# Patient Record
Sex: Male | Born: 1960 | Race: Black or African American | Hispanic: No | Marital: Married | Smoking: Former smoker
Health system: Southern US, Community
[De-identification: ages and names within clinical notes are randomized; demographics above are authoritative.]

## PROBLEM LIST (undated history)

## (undated) DIAGNOSIS — K219 Gastro-esophageal reflux disease without esophagitis: Secondary | ICD-10-CM

## (undated) DIAGNOSIS — G8929 Other chronic pain: Secondary | ICD-10-CM

## (undated) DIAGNOSIS — H524 Presbyopia: Secondary | ICD-10-CM

## (undated) DIAGNOSIS — K029 Dental caries, unspecified: Secondary | ICD-10-CM

## (undated) DIAGNOSIS — M545 Low back pain: Secondary | ICD-10-CM

## (undated) DIAGNOSIS — E119 Type 2 diabetes mellitus without complications: Secondary | ICD-10-CM

## (undated) DIAGNOSIS — E785 Hyperlipidemia, unspecified: Secondary | ICD-10-CM

## (undated) DIAGNOSIS — H9190 Unspecified hearing loss, unspecified ear: Secondary | ICD-10-CM

## (undated) HISTORY — DX: Presbyopia: H52.4

## (undated) HISTORY — DX: Other chronic pain: G89.29

## (undated) HISTORY — DX: Hyperlipidemia, unspecified: E78.5

## (undated) HISTORY — DX: Dental caries, unspecified: K02.9

## (undated) HISTORY — DX: Low back pain: M54.5

## (undated) HISTORY — DX: Gastro-esophageal reflux disease without esophagitis: K21.9

## (undated) HISTORY — DX: Type 2 diabetes mellitus without complications: E11.9

## (undated) HISTORY — DX: Unspecified hearing loss, unspecified ear: H91.90

---

## 2002-07-20 ENCOUNTER — Encounter: Admission: RE | Admit: 2002-07-20 | Discharge: 2002-07-20 | Payer: Self-pay | Admitting: Family Medicine

## 2002-07-20 ENCOUNTER — Encounter: Payer: Self-pay | Admitting: Family Medicine

## 2002-07-29 ENCOUNTER — Encounter: Admission: RE | Admit: 2002-07-29 | Discharge: 2002-09-08 | Payer: Self-pay | Admitting: Family Medicine

## 2003-08-11 ENCOUNTER — Encounter: Admission: RE | Admit: 2003-08-11 | Discharge: 2003-08-11 | Payer: Self-pay | Admitting: Family Medicine

## 2003-08-11 ENCOUNTER — Encounter: Payer: Self-pay | Admitting: Family Medicine

## 2003-12-16 HISTORY — PX: BUNIONECTOMY: SHX129

## 2005-05-15 ENCOUNTER — Encounter: Admission: RE | Admit: 2005-05-15 | Discharge: 2005-05-15 | Payer: Self-pay | Admitting: Family Medicine

## 2007-01-21 ENCOUNTER — Emergency Department (HOSPITAL_COMMUNITY): Admission: EM | Admit: 2007-01-21 | Discharge: 2007-01-21 | Payer: Self-pay | Admitting: Family Medicine

## 2007-10-04 ENCOUNTER — Emergency Department (HOSPITAL_COMMUNITY): Admission: EM | Admit: 2007-10-04 | Discharge: 2007-10-04 | Payer: Self-pay | Admitting: Family Medicine

## 2007-11-04 ENCOUNTER — Emergency Department (HOSPITAL_COMMUNITY): Admission: EM | Admit: 2007-11-04 | Discharge: 2007-11-04 | Payer: Self-pay | Admitting: Emergency Medicine

## 2007-11-19 ENCOUNTER — Ambulatory Visit: Payer: Self-pay | Admitting: Family Medicine

## 2008-04-27 ENCOUNTER — Ambulatory Visit: Payer: Self-pay | Admitting: Family Medicine

## 2008-05-09 ENCOUNTER — Ambulatory Visit: Payer: Self-pay | Admitting: Family Medicine

## 2008-08-16 ENCOUNTER — Ambulatory Visit: Payer: Self-pay | Admitting: Family Medicine

## 2008-09-22 ENCOUNTER — Ambulatory Visit: Payer: Self-pay | Admitting: Family Medicine

## 2008-11-13 ENCOUNTER — Ambulatory Visit: Payer: Self-pay | Admitting: Family Medicine

## 2008-12-15 DIAGNOSIS — M545 Low back pain, unspecified: Secondary | ICD-10-CM

## 2008-12-15 HISTORY — DX: Low back pain, unspecified: M54.50

## 2009-08-15 LAB — HM COLONOSCOPY: HM Colonoscopy: NORMAL

## 2009-08-21 ENCOUNTER — Ambulatory Visit: Payer: Self-pay | Admitting: Family Medicine

## 2010-08-12 ENCOUNTER — Ambulatory Visit: Payer: Self-pay | Admitting: Family Medicine

## 2010-12-15 HISTORY — PX: OTHER SURGICAL HISTORY: SHX169

## 2011-01-05 ENCOUNTER — Encounter: Payer: Self-pay | Admitting: Neurosurgery

## 2011-01-30 ENCOUNTER — Ambulatory Visit: Payer: Self-pay | Admitting: Family Medicine

## 2011-04-09 ENCOUNTER — Encounter (HOSPITAL_COMMUNITY)
Admission: RE | Admit: 2011-04-09 | Discharge: 2011-04-09 | Disposition: A | Discharge status: Home / Self Care | Payer: Worker's Compensation | Source: Ambulatory Visit | Attending: Neurosurgery | Admitting: Neurosurgery

## 2011-04-09 LAB — CBC
HCT: 43.3 % (ref 39.0–52.0)
Hemoglobin: 14.6 g/dL (ref 13.0–17.0)
MCH: 29.6 pg (ref 26.0–34.0)
MCHC: 33.7 g/dL (ref 30.0–36.0)
MCV: 87.8 fL (ref 78.0–100.0)
Platelets: 273 10*3/uL (ref 150–400)
RBC: 4.93 MIL/uL (ref 4.22–5.81)

## 2011-04-09 LAB — SURGICAL PCR SCREEN
MRSA, PCR: NEGATIVE
Staphylococcus aureus: NEGATIVE

## 2011-04-17 ENCOUNTER — Other Ambulatory Visit (HOSPITAL_COMMUNITY): Payer: Self-pay | Admitting: Neurosurgery

## 2011-04-17 ENCOUNTER — Inpatient Hospital Stay (HOSPITAL_COMMUNITY)
Admission: RE | Admit: 2011-04-17 | Discharge: 2011-04-21 | DRG: 460 | Disposition: A | Discharge status: Home Health | Payer: Worker's Compensation | Source: Ambulatory Visit | Attending: Neurosurgery | Admitting: Neurosurgery

## 2011-04-17 ENCOUNTER — Ambulatory Visit (HOSPITAL_COMMUNITY)
Admission: RE | Admit: 2011-04-17 | Discharge: 2011-04-17 | Disposition: A | Discharge status: Home / Self Care | Payer: Worker's Compensation | Source: Ambulatory Visit | Attending: Neurosurgery | Admitting: Neurosurgery

## 2011-04-17 DIAGNOSIS — Z01812 Encounter for preprocedural laboratory examination: Secondary | ICD-10-CM

## 2011-04-17 DIAGNOSIS — M51379 Other intervertebral disc degeneration, lumbosacral region without mention of lumbar back pain or lower extremity pain: Principal | ICD-10-CM | POA: Diagnosis present

## 2011-04-17 DIAGNOSIS — M5137 Other intervertebral disc degeneration, lumbosacral region: Principal | ICD-10-CM | POA: Diagnosis present

## 2011-04-17 DIAGNOSIS — M549 Dorsalgia, unspecified: Secondary | ICD-10-CM

## 2011-04-17 LAB — TYPE AND SCREEN: Antibody Screen: NEGATIVE

## 2011-04-18 LAB — GLUCOSE, CAPILLARY
Glucose-Capillary: 158 mg/dL — ABNORMAL HIGH (ref 70–99)
Glucose-Capillary: 144 mg/dL — ABNORMAL HIGH (ref 70–99)

## 2011-04-19 ENCOUNTER — Encounter: Payer: Self-pay | Admitting: Family Medicine

## 2011-05-16 NOTE — Op Note (Signed)
NAME:  Parker Harvey, Parker Harvey               ACCOUNT NO.:  [id number]  MEDICAL RECORD NO.:  [id number]         PATIENT TYPE:  CINP  LOCATION:                               FACILITY:  [facility]  PHYSICIAN:  Donalee Citrin, M.D.        DATE OF BIRTH:  Jan 31, 1961  DATE OF PROCEDURE:  04/17/2011 DATE OF DISCHARGE:  04/17/2011                              OPERATIVE REPORT   PREOPERATIVE DIAGNOSIS:  Degenerative disk disease and discogenic mechanical low back pain, L4-5 and L5-S1.  PROCEDURE:  Posterior lumbar interbody fusion, L4-5 and L5-S1, using a hybrid Telamon and PEEK cage packed with locally harvested autograft, mixed with Actifuse and tangent allograft wedge, with pedicle screw fixation using a Globus REVERE  pedicle screw system from L4-S1 posterolateral arthrodesis, using locally harvested autograft, mixed with Actifuse L4-S1, and placement of a large Hemovac drain.  SURGEON:  Donalee Citrin, MD.  ASSISTANT:  Tia Alert, MD.  ANESTHESIA:  General endotracheal.  HISTORY OF PRESENT ILLNESS:  The patient is a 50 year old gentleman, who has had progressive worsening back and bilateral leg pain after work- related injury.  The patient then worked up extensively, undergone extensive conservative approaches and ultimately underwent discography for workup of discogenic mechanical back pain with an MRI scan showed annular tears and central disk bulges at L4-L5 and L5-S1.  Discography was strongly concordant at L4-L5 and L5-S1 with a strong positive control that was negative for reproducing his pain at L3-4, so the patient is recommended L4-L5 and L5-S1 fusion.  All the risks and benefits of the operation were explained to the patient.  He understood and agreed to proceed forth.  PROCEDURE IN DETAIL:  The patient was brought to the OR, was induced general anesthesia, positioned prone on the Holloway frame.  Back was prepped and draped in the usual fashion.  A midline incision was made centering over  the L4-5 and L5-S1 disk spaces after infiltration of 10 mL of lidocaine with epinephrine.  Bovie cautery was used to take down the subcutaneous tissues and subperiosteal dissection was carried out bilaterally at L4 down S1 with T-piece at L4-5 and S1 exposed. Intraoperative x-ray identified the appropriate levels of spinous processes at L4-L5 and removed.  Central decompression was completed. Complete medial facetectomies were performed bilaterally.  Aggressive unroofing of the L4-5 and S1 foramens were carried out and under biting the superior articulating facet to each level to gain lateral access to the disk spaces.  Epidural veins were then coagulated.  Attention was then taken first to the pedicle screw placement.  Using a high-speed drill, pilot holes were drilled at L4 on the right.  Cannula with the awl probe, tapped with a 5 x 5 tap, probed again with 6.5 x 45 screws inserted at L4 on the right, 6.5 x 45 at L5, and 6.5 x 35 at S1, sequentially in a similar fashion.  Fluoroscopy was used at each step along the way.  External and internal bony landmarks and probing from within the pedicle confirmed no mediolateral breach.  Then, attention was taken to interbody work, using a D'Errico to reflect the right  L4 nerve root medially.  Disk space was cleaned out.  A size 10 distractor was inserted.  This was felt to be too small for the disk spaces, so we replaced with a 12, had good apposition of the endplates and felt to be appropriate sizing for the implant, so then a disk space was cleaned out radically from the left side with a 12 distractor placed on the right. Using a size 12 cutter and chisel, endplates prepped with Epstein curettes, pituitary rongeurs, and scrapers were used to prepare the endplates.  Then, a 12 x 22 mm PEEK cage packed with local autograft, mixed with Actifuse, inserted on the patient's left side.  The distractor was removed and in similar fashion endplates were  prepped. Local autograft was packed centrally and a right-sided tangent was inserted.  Then, done at L5-S1 in similar fashion, 12 x 22 Telamon on the right, 12 x 26 tangent on the left with local autograft packed centrally.  Both disk spaces were noted to be markedly degenerated, preserving fairly tall disk height, however, the disk wall disrupted and degenerated.  After all interbody work were done, pedicle screws were placed on the left side in similar fashion with 6.5 x 45 at L4 on the left, 6.5 x 40 at L5, 6.5 x 35 at S1. Then, the wound was copiously irrigated.  Meticulous hemostasis was maintained.  Aggressive decortication was carried at T-piece and lateral gutters.  Remainder of local autograft was packed posterolaterally, 65-mm rods placed with nuts tightened down at S1. The L5 screws compressing S1 and L4 and L5.  There was good rod showing at end of each construct.  A cross-link was then applied. The foramina were reinspected prior to cross-link placement to confirm patency and no migration of graft material, and then Gelfoam was laid on top of dura.  Medium Hemovac drain was placed and wound was closed in layers with interrupted Vicryl and skin was closed with running 4-0 subcuticular.  Benzoin and Steri-Strips were applied.  The patient went to the recovery room in stable condition.  At the end of the case, sponge and instrument count were correct.          ______________________________ Donalee Citrin, M.D.     GC/MEDQ  D:  04/17/2011  T:  04/18/2011  Job:  [id number]  Electronically Signed by Donalee Citrin M.D. on 05/16/2011 06:50:42 AM

## 2011-05-27 ENCOUNTER — Ambulatory Visit
Admission: RE | Admit: 2011-05-27 | Discharge: 2011-05-27 | Disposition: A | Discharge status: Home / Self Care | Payer: Worker's Compensation | Source: Ambulatory Visit | Attending: Neurosurgery | Admitting: Neurosurgery

## 2011-05-27 ENCOUNTER — Other Ambulatory Visit: Payer: Self-pay | Admitting: Neurosurgery

## 2011-05-27 DIAGNOSIS — M545 Low back pain: Secondary | ICD-10-CM

## 2011-06-04 NOTE — Discharge Summary (Signed)
  NAME:  Parker Harvey, Parker Harvey               ACCOUNT NO.:  [id number]  MEDICAL RECORD NO.:  [id number]           PATIENT TYPE:  I  LOCATION:  3036                         FACILITY:  [facility]  PHYSICIAN:  Donalee Citrin, M.D.        DATE OF BIRTH:  Jan 10, 1961  DATE OF ADMISSION:  04/17/2011 DATE OF DISCHARGE:  04/21/2011                              DISCHARGE SUMMARY   ADMITTING DIAGNOSIS:  Degenerative disk disease, lumbar spinal stenosis at L4-5 and L5-S1.  PROCEDURE:  Decompression stabilization procedure at L4-5 and L5-S1.  HOSPITAL COURSE:  The patient was admitted as an EMA, went to the operating room, and underwent the aforementioned procedure.  Postop, the patient did very well and recovering on the floor.  On the floor, the patient mobilized well, has significant improvement in his preoperative leg pain, but did have a fair amount of incisional pain.  His pain is progressively controlled within the first couple of days.  He had a low grade fever that was treated with incentive spirometer.  Postop day 4, he was able to be discharged home with scheduled followup in approximately 2 weeks. At the time of discharge, Parker Harvey incision was clean and dry, he was ambulating and voiding spontaneously and tolerating regular diet.          ______________________________ Donalee Citrin, M.D.     GC/MEDQ  D:  05/16/2011  T:  05/16/2011  Job:  [id number]  Electronically Signed by Donalee Citrin M.D. on 06/04/2011 08:33:17 AM

## 2011-06-09 ENCOUNTER — Ambulatory Visit: Payer: Self-pay | Admitting: Medical

## 2011-08-28 ENCOUNTER — Other Ambulatory Visit: Payer: Self-pay | Admitting: Neurosurgery

## 2011-08-28 ENCOUNTER — Ambulatory Visit
Admission: RE | Admit: 2011-08-28 | Discharge: 2011-08-28 | Disposition: A | Discharge status: Home / Self Care | Payer: Worker's Compensation | Source: Ambulatory Visit | Attending: Neurosurgery | Admitting: Neurosurgery

## 2011-08-28 DIAGNOSIS — M545 Low back pain: Secondary | ICD-10-CM

## 2011-09-23 LAB — URINALYSIS, ROUTINE W REFLEX MICROSCOPIC
Bilirubin Urine: NEGATIVE
Ketones, ur: NEGATIVE
Nitrite: NEGATIVE
Protein, ur: NEGATIVE
Urobilinogen, UA: 0.2

## 2011-09-23 LAB — I-STAT 8, (EC8 V) (CONVERTED LAB)
Acid-base deficit: 1
HCT: 42
Operator id: 198171
Potassium: 4.2
Sodium: 141
TCO2: 25
pH, Ven: 7.402 — ABNORMAL HIGH

## 2011-09-23 LAB — DIFFERENTIAL
Basophils Absolute: 0
Basophils Relative: 1
Lymphocytes Relative: 29
Monocytes Absolute: 0.8
Neutro Abs: 3.8
Neutrophils Relative %: 57

## 2011-09-23 LAB — CBC
MCHC: 33.4
Platelets: 225
RDW: 12.8

## 2011-09-23 LAB — POCT CARDIAC MARKERS: Myoglobin, poc: 88.7

## 2011-11-13 ENCOUNTER — Ambulatory Visit
Admission: RE | Admit: 2011-11-13 | Discharge: 2011-11-13 | Disposition: A | Discharge status: Home / Self Care | Payer: Worker's Compensation | Source: Ambulatory Visit | Attending: Neurosurgery | Admitting: Neurosurgery

## 2011-11-13 ENCOUNTER — Other Ambulatory Visit: Payer: Self-pay | Admitting: Neurosurgery

## 2011-11-13 DIAGNOSIS — M545 Low back pain, unspecified: Secondary | ICD-10-CM

## 2013-08-10 ENCOUNTER — Encounter: Payer: Self-pay | Admitting: Internal Medicine

## 2013-08-18 ENCOUNTER — Encounter: Payer: Self-pay | Admitting: Family Medicine

## 2013-08-18 ENCOUNTER — Other Ambulatory Visit: Payer: Self-pay | Admitting: Family Medicine

## 2013-08-18 ENCOUNTER — Ambulatory Visit (INDEPENDENT_AMBULATORY_CARE_PROVIDER_SITE_OTHER): Payer: BC Managed Care – PPO | Admitting: Family Medicine

## 2013-08-18 VITALS — BP 132/100 | HR 70 | Ht 63.5 in | Wt 136.0 lb

## 2013-08-18 DIAGNOSIS — Z23 Encounter for immunization: Secondary | ICD-10-CM

## 2013-08-18 DIAGNOSIS — G8929 Other chronic pain: Secondary | ICD-10-CM | POA: Insufficient documentation

## 2013-08-18 DIAGNOSIS — Z0000 Encounter for general adult medical examination without abnormal findings: Secondary | ICD-10-CM

## 2013-08-18 DIAGNOSIS — M549 Dorsalgia, unspecified: Secondary | ICD-10-CM

## 2013-08-18 DIAGNOSIS — F329 Major depressive disorder, single episode, unspecified: Secondary | ICD-10-CM

## 2013-08-18 MED ORDER — DULOXETINE HCL 30 MG PO CPEP: 30.0000 mg | ORAL_CAPSULE | Freq: Every day | ORAL | Status: DC

## 2013-08-18 NOTE — Progress Notes (Signed)
Subjective:    Patient ID: Parker Harvey, male    DOB: 1961/09/05, 52 y.o.   MRN: [id number]  HPI He is here for complete examination. He has not been here in over 3 years. In that time he has had back surgery which apparently failed and he is now in chronic pain management. 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 He does have a cane and is using a back brace. He does complain of a one-year history of intermittent difficulty with loose stools. He he does continue on regular doses of codeine to help with his back pain. He also has had some depression symptoms from this. The PHQ screen was performed. He also states that his pain doctor, Dr. Murray Hodgkins mention something about a spot on his kidneys. I have no record of this. There is also a question of cancer in the past however he has never had any diagnosis of cancer. Social and family history were reviewed.   Review of Systems  Constitutional: Negative.   HENT: Negative.   Eyes: Negative.   Respiratory: Negative.   Cardiovascular: Negative.   Gastrointestinal: Negative.   Endocrine: Negative.   Genitourinary: Negative.   Musculoskeletal: Negative.   Skin: Negative.   Psychiatric/Behavioral: Positive for dysphoric mood. Negative for suicidal ideas.       Objective:   Physical Exam BP 132/100  Pulse 70  Ht 5' 3.5" (1.613 m)  Wt 136 lb (61.689 kg)  BMI 23.71 kg/m2  SpO2 98%  General Appearance:    Alert, cooperative, no distress, appears stated age  Head:    Normocephalic, without obvious abnormality, atraumatic  Eyes:    PERRL, conjunctiva/corneas clear, EOM's intact, fundi    benign  Ears:    Normal TM's and external ear canals  Nose:   Nares normal, mucosa normal, no drainage or sinus   tenderness  Throat:   Lips, mucosa, and tongue normal; teeth and gums normal  Neck:   Supple, no lymphadenopathy;  thyroid:  no   enlargement/tenderness/nodules; no carotid  bruit or JVD  Back:    Spine nontender, no curvature, ROM normal, no CVA     tenderness  Lungs:     Clear to auscultation bilaterally without wheezes, rales or     ronchi; respirations unlabored  Chest Wall:    No tenderness or deformity   Heart:    Regular rate and rhythm, S1 and S2 normal, no murmur, rub   or gallop  Breast Exam:    No chest wall tenderness, masses or gynecomastia  Abdomen:     Soft, non-tender, nondistended, normoactive bowel sounds,    no masses, no hepatosplenomegaly        Extremities:   No clubbing, cyanosis or edema  Pulses:   2+ and symmetric all extremities  Skin:   Skin color, texture, turgor normal, no rashes or lesions  Lymph nodes:   Cervical, supraclavicular, and axillary nodes normal  Neurologic:   CNII-XII intact, normal strength, sensation and gait; reflexes 2+ and symmetric throughout          Psych:   Normal mood, affect, hygiene and grooming.          Assessment & Plan:  Routine general medical examination at a health care facility - Plan: CBC with Differential, Comprehensive metabolic panel, Lipid panel, Urinalysis Dipstick, Flu Vaccine QUAD 36+ mos IM  Depressive disorder, not elsewhere classified - Plan: DULoxetine (CYMBALTA) 30 MG  capsule  Chronic back pain - Plan: DULoxetine (CYMBALTA) 30 MG capsule  Need for prophylactic vaccination and inoculation against influenza  since we'll need to see gastroenterology, I will defer any further evaluation of his loose stools to them. Discussed the need for him to stop all alcohol which he has agreed to do. I will start him on Cymbalta explaining that it did help both his underlying depression and pain management. Routine blood screening and followup on the potential renal issue when I receive more data. Discussion the flu shot given including risks and benefits.

## 2013-08-18 NOTE — Patient Instructions (Addendum)
Cut out all alcohol .At least check into disability to see how much she would receive Take one Cymbalta a day for the first week and then double it to 2 per day. I'll see you back here in about 3 --4 weeks

## 2013-08-19 LAB — CBC WITH DIFFERENTIAL/PLATELET
Basophils Absolute: 0 10*3/uL (ref 0.0–0.1)
HCT: 41.9 % (ref 39.0–52.0)
Lymphocytes Relative: 25 % (ref 12–46)
Monocytes Absolute: 0.6 10*3/uL (ref 0.1–1.0)
Neutro Abs: 4.1 10*3/uL (ref 1.7–7.7)
Platelets: 242 10*3/uL (ref 150–400)
RBC: 4.67 MIL/uL (ref 4.22–5.81)
RDW: 14.3 % (ref 11.5–15.5)
WBC: 6.3 10*3/uL (ref 4.0–10.5)

## 2013-08-19 LAB — COMPREHENSIVE METABOLIC PANEL
ALT: 64 U/L — ABNORMAL HIGH (ref 0–53)
AST: 118 U/L — ABNORMAL HIGH (ref 0–37)
Albumin: 4.2 g/dL (ref 3.5–5.2)
Calcium: 9.3 mg/dL (ref 8.4–10.5)
Chloride: 98 mEq/L (ref 96–112)
Potassium: 5 mEq/L (ref 3.5–5.3)
Total Protein: 7.6 g/dL (ref 6.0–8.3)

## 2013-08-22 LAB — HEPATITIS PANEL, ACUTE
HCV Ab: NEGATIVE
Hep A IgM: NEGATIVE
Hep B C IgM: NEGATIVE
Hepatitis B Surface Ag: NEGATIVE

## 2013-08-29 ENCOUNTER — Telehealth: Payer: Self-pay | Admitting: Family Medicine

## 2013-08-29 NOTE — Telephone Encounter (Signed)
He states that when he started taking the Cymbalta he had similar symptoms that he is having now. The symptoms started after he doubled the dose. I recommended that he continue on this medication for several more days and let us know.

## 2013-08-29 NOTE — Telephone Encounter (Signed)
Pt called said cymbalta is slowing his urine, but feels like it is squeezing him and choking him.  Please call pt at [phone] and advise

## 2013-09-05 ENCOUNTER — Telehealth: Payer: Self-pay | Admitting: Family Medicine

## 2013-09-05 MED ORDER — DULOXETINE HCL 60 MG PO CPEP: 60.0000 mg | ORAL_CAPSULE | Freq: Every day | ORAL | Status: DC

## 2013-09-05 NOTE — Telephone Encounter (Signed)
Go ahead and switch him to the 60 mg pill at. Explain this to him. Have him recheck with [place] in one month.

## 2013-09-05 NOTE — Telephone Encounter (Signed)
DR.LALONDE IS THIS RIGHT

## 2013-09-05 NOTE — Telephone Encounter (Signed)
PT INFORMED NEW RX SENT IN FOR 60MG  1 QD AND TO RECHECK IN 1 MTH PT VERBALIZED UNDERSTANDING

## 2013-09-08 ENCOUNTER — Telehealth: Payer: Self-pay | Admitting: Family Medicine

## 2013-09-08 NOTE — Telephone Encounter (Signed)
Doubt the bones are coming from the medication. Scheduled to be seen tomorrow by Vincenza Hews or Eve.

## 2013-09-08 NOTE — Telephone Encounter (Signed)
PT HAS APPOINTMENT 9/26 WITH DR.KNAPP

## 2013-09-09 ENCOUNTER — Ambulatory Visit: Payer: Self-pay | Admitting: Family Medicine

## 2013-09-21 ENCOUNTER — Ambulatory Visit (INDEPENDENT_AMBULATORY_CARE_PROVIDER_SITE_OTHER): Payer: BC Managed Care – PPO | Admitting: Family Medicine

## 2013-09-21 ENCOUNTER — Encounter: Payer: Self-pay | Admitting: Family Medicine

## 2013-09-21 VITALS — BP 140/100 | HR 104 | Wt 133.0 lb

## 2013-09-21 DIAGNOSIS — M549 Dorsalgia, unspecified: Secondary | ICD-10-CM

## 2013-09-21 DIAGNOSIS — G8929 Other chronic pain: Secondary | ICD-10-CM

## 2013-09-21 DIAGNOSIS — F329 Major depressive disorder, single episode, unspecified: Secondary | ICD-10-CM

## 2013-09-21 NOTE — Patient Instructions (Signed)
Stop the medicine for the rest this week and call me next week and let me know.

## 2013-09-21 NOTE — Progress Notes (Signed)
  Subjective:    Patient ID: Parker Harvey, male    DOB: 06-13-1961, 52 y.o.   MRN: [id number]  HPI He is here for recheck. He states that since starting the Cymbalta he has noted difficulty with urinating. He has the urge but cannot get his stream going. He does state that the medicine did help reduce his pain.   Review of Systems     Objective:   Physical Exam Alert and in no distress otherwise not examined       Assessment & Plan:  Chronic back pain  Depressive disorder, not elsewhere classified  he is to stop the Cymbalta through the weekend and see if this will help with normal urinary flow. If it doesn't I will switch to Effexor

## 2013-09-27 ENCOUNTER — Telehealth: Payer: Self-pay | Admitting: Internal Medicine

## 2013-09-27 NOTE — Telephone Encounter (Signed)
Pt states that he was doing well being off the cymbalta but he is having trouble sleeping. He states that he has nightmares at night. If you want to send something to his pharmacy he uses [facility]. He would like you to call him

## 2013-09-27 NOTE — Telephone Encounter (Signed)
He has been off Cymbalta for 8 days. He is having some difficulty with nightmares but states that they're getting slightly better. Recommended watchful waiting and the use of an OTC sleep meds for several days.

## 2014-05-25 ENCOUNTER — Ambulatory Visit: Payer: BC Managed Care – PPO | Admitting: Family Medicine

## 2014-05-29 ENCOUNTER — Ambulatory Visit (INDEPENDENT_AMBULATORY_CARE_PROVIDER_SITE_OTHER): Payer: BC Managed Care – PPO | Admitting: Family Medicine

## 2014-05-29 ENCOUNTER — Telehealth: Payer: Self-pay | Admitting: Family Medicine

## 2014-05-29 ENCOUNTER — Encounter: Payer: Self-pay | Admitting: Family Medicine

## 2014-05-29 VITALS — BP 102/80 | Wt 136.0 lb

## 2014-05-29 DIAGNOSIS — K602 Anal fissure, unspecified: Secondary | ICD-10-CM

## 2014-05-29 NOTE — Progress Notes (Signed)
   Subjective:    Patient ID: Parker Harvey, male    DOB: 1960-12-20, 53 y.o.   MRN: [id number]  HPI He is here for evaluation of anal discomfort. He has a previous history of anal fissure. For the last several weeks he's also been on codeine to help with back pain and notes increased difficulty with constipation as well as some anal discomfort.   Review of Systems     Objective:   Physical Exam Alert and in no distress. Exam of the anus does show a healing lesion present at 6 and 12:00.       Assessment & Plan:  Anal fissure  information concerning anal fissure given. Recommended fluids, bulk in diet, exercise as much is possible. Also recommended sitz baths. He apparently was given an unknown medication and will check at home and let me know. I will probably call this in.

## 2014-05-29 NOTE — Patient Instructions (Signed)
Anal Fissure, Adult An anal fissure is a small tear or crack in the skin around the anus. Bleeding from a fissure usually stops on its own within a few minutes. However, bleeding will often reoccur with each bowel movement until the crack heals.  CAUSES   Passing large, hard stools.  Frequent diarrheal stools.  Constipation.  Inflammatory bowel disease (Crohn's disease or ulcerative colitis).  Infections.  Anal sex. SYMPTOMS   Small amounts of blood seen on your stools, on toilet paper, or in the toilet after a bowel movement.  Rectal bleeding.  Painful bowel movements.  Itching or irritation around the anus. DIAGNOSIS Your caregiver will examine the anal area. An anal fissure can usually be seen with careful inspection. A rectal exam may be performed and a short tube (anoscope) may be used to examine the anal canal. TREATMENT   You may be instructed to take fiber supplements. These supplements can soften your stool to help make bowel movements easier.  Sitz baths may be recommended to help heal the tear. Do not use soap in the sitz baths.  A medicated cream or ointment may be prescribed to lessen discomfort. HOME CARE INSTRUCTIONS   Maintain a diet high in fruits, whole grains, and vegetables. Avoid constipating foods like bananas and dairy products.  Take sitz baths as directed by your caregiver.  Drink enough fluids to keep your urine clear or pale yellow.  Only take over-the-counter or prescription medicines for pain, discomfort, or fever as directed by your caregiver. Do not take aspirin as this may increase bleeding.  Do not use ointments containing numbing medications (anesthetics) or hydrocortisone. They could slow healing. SEEK MEDICAL CARE IF:   Your fissure is not completely healed within 3 days.  You have further bleeding.  You have a fever.  You have diarrhea mixed with blood.  You have pain.  Your problem is getting worse rather than  better. MAKE SURE YOU:   Understand these instructions.  Will watch your condition.  Will get help right away if you are not doing well or get worse. Document Released: 12/01/2005 Document Revised: 02/23/2012 Document Reviewed: 05/18/2011 [facility] Patient Information 2014 [place], [place]. Plenty of fluids, bulk in your diet, as much exercise as you can do, warm tub baths or shower. Can use Metamucil if you want.

## 2014-05-29 NOTE — Telephone Encounter (Signed)
Go ahead and call us in and say to apply to affected area 2 or 3 times per day as needed

## 2014-05-30 NOTE — Telephone Encounter (Signed)
Medication called in 

## 2014-07-26 DIAGNOSIS — Z029 Encounter for administrative examinations, unspecified: Secondary | ICD-10-CM

## 2014-10-23 ENCOUNTER — Ambulatory Visit: Payer: BC Managed Care – PPO | Admitting: Family Medicine

## 2015-06-01 ENCOUNTER — Telehealth: Payer: Self-pay | Admitting: Internal Medicine

## 2015-06-01 NOTE — Telephone Encounter (Signed)
Faxed over medical records to [facility] @ [phone]  Today 06/01/15

## 2015-08-27 ENCOUNTER — Other Ambulatory Visit: Payer: Self-pay | Admitting: Internal Medicine

## 2015-08-27 DIAGNOSIS — G8929 Other chronic pain: Secondary | ICD-10-CM

## 2015-08-27 DIAGNOSIS — M545 Low back pain: Principal | ICD-10-CM

## 2015-09-03 ENCOUNTER — Ambulatory Visit
Admission: RE | Admit: 2015-09-03 | Discharge: 2015-09-03 | Disposition: A | Discharge status: Home / Self Care | Payer: No Typology Code available for payment source | Source: Ambulatory Visit | Attending: Internal Medicine | Admitting: Internal Medicine

## 2015-09-03 DIAGNOSIS — M545 Low back pain, unspecified: Secondary | ICD-10-CM

## 2015-09-03 DIAGNOSIS — G8929 Other chronic pain: Secondary | ICD-10-CM

## 2015-09-03 MED ORDER — METHYLPREDNISOLONE ACETATE 40 MG/ML INJ SUSP (RADIOLOG
120.0000 mg | Freq: Once | INTRAMUSCULAR | Status: AC
  Administered 2015-09-03: 120 mg via EPIDURAL

## 2015-09-03 MED ORDER — IOHEXOL 180 MG/ML  SOLN
1.0000 mL | Freq: Once | INTRAMUSCULAR | Status: DC | PRN
  Administered 2015-09-03: 1 mL via EPIDURAL

## 2015-09-03 NOTE — Discharge Instructions (Signed)

## 2015-10-23 ENCOUNTER — Telehealth: Payer: Self-pay | Admitting: Internal Medicine

## 2015-10-23 NOTE — Telephone Encounter (Signed)
Spoke with patient he has appointment for Nov 19th so he will wait until then

## 2015-10-23 NOTE — Telephone Encounter (Signed)
Patient called and would like to speak with Dr. Delrae AlfredMulberry.  He has been experiencing hair loss under his arms and on his chest.  Please call [phone].

## 2015-10-23 NOTE — Telephone Encounter (Signed)
Please let him know that I would likely have to see him first and get more info and take a look at him.

## 2015-11-23 LAB — POCT GLUCOSE (2 HR PP): Glucose 2 Hr PP, POC: 112 mg/dL

## 2015-12-03 ENCOUNTER — Encounter: Payer: Self-pay | Admitting: Internal Medicine

## 2015-12-03 ENCOUNTER — Ambulatory Visit (INDEPENDENT_AMBULATORY_CARE_PROVIDER_SITE_OTHER): Payer: No Typology Code available for payment source | Admitting: Internal Medicine

## 2015-12-03 VITALS — BP 118/76 | HR 74 | Ht 62.5 in | Wt 147.0 lb

## 2015-12-03 DIAGNOSIS — K029 Dental caries, unspecified: Secondary | ICD-10-CM | POA: Insufficient documentation

## 2015-12-03 DIAGNOSIS — M549 Dorsalgia, unspecified: Secondary | ICD-10-CM

## 2015-12-03 DIAGNOSIS — G8929 Other chronic pain: Secondary | ICD-10-CM

## 2015-12-03 DIAGNOSIS — H524 Presbyopia: Secondary | ICD-10-CM | POA: Insufficient documentation

## 2015-12-03 MED ORDER — GABAPENTIN 300 MG PO CAPS: ORAL_CAPSULE | ORAL | Status: DC

## 2015-12-03 NOTE — Progress Notes (Signed)
   Subjective:    Patient ID: Parker Harvey, male    DOB: [birth year], 54 y.o.   MRN: [id number]  HPI  1.  Low Back Pain:  Pt. Went for epidural injection of low back about 2-3 months ago.  Had improvement for 1 month.  Did not hear from PT.  Sent a referral in August.   Pt. Not interested in repeat epidural injections--states had at least 3 with Dr. Murray HodgkinsBartko in past, but I was not able to find definitive info in old records we received from his office.   Sometimes low back pain keeps him up at night.  2.  Eye referral:  Not clear, but patient states for the referrals we ordered, he received paperwork and states he did not meet criteria.  Denies ever letting his orange card expire.  Uses reading glasses, but would like eye exam as reading not as clear as he would like it to be.    3.  Dental Referral:  As above.         Review of Systems     Objective:   Physical Exam  HEENT:  Multiple missing and caried teeth.  Gingiva without significant erythema and swelling. Neck:  Supple, no adenopathy Chest:  CTA CV:  RRR without murmur or rub, radial pulses normal and equal.      Assessment & Plan:  1.  Chronic low back pain:  Continue Diclofenac--does help.  Rerefer to PT.  Did not get a slot when referred previously--let pt. Know to call if he does not hear from orange card by end of January. Unable to get Lidoderm patches through [facility] any longer. Pt. States he has never taken Gabapentin/Neurontin and would be willing to try. 2.  Dental Decay-rerefer to Dental 3.  Presbyopia (likely)  Rerefer to optometry.

## 2015-12-03 NOTE — Patient Instructions (Addendum)
Can google "advance directives, [place]"  And bring up form from [place] of [place]. Print and fill out Or can go to "5 wishes"  Which is also in Spanish and fill out--this costs $5--perhaps easier to use. Designate a Medical Power of Attorney to speak for you if you are unable to speak for yourself when ill or injured  Call if you do not hear about referrals for eye, teeth, PT by end of January.

## 2015-12-16 DIAGNOSIS — E782 Mixed hyperlipidemia: Secondary | ICD-10-CM | POA: Insufficient documentation

## 2015-12-16 DIAGNOSIS — E785 Hyperlipidemia, unspecified: Secondary | ICD-10-CM

## 2015-12-16 HISTORY — DX: Hyperlipidemia, unspecified: E78.5

## 2016-01-08 ENCOUNTER — Telehealth: Payer: Self-pay | Admitting: Internal Medicine

## 2016-01-18 ENCOUNTER — Encounter: Payer: Self-pay | Admitting: Internal Medicine

## 2016-01-18 ENCOUNTER — Ambulatory Visit (INDEPENDENT_AMBULATORY_CARE_PROVIDER_SITE_OTHER): Payer: Self-pay | Admitting: Internal Medicine

## 2016-01-18 VITALS — BP 116/78 | HR 70 | Ht 62.5 in | Wt 149.0 lb

## 2016-01-18 DIAGNOSIS — M549 Dorsalgia, unspecified: Secondary | ICD-10-CM

## 2016-01-18 DIAGNOSIS — G4700 Insomnia, unspecified: Secondary | ICD-10-CM | POA: Insufficient documentation

## 2016-01-18 DIAGNOSIS — B351 Tinea unguium: Secondary | ICD-10-CM | POA: Insufficient documentation

## 2016-01-18 DIAGNOSIS — R252 Cramp and spasm: Secondary | ICD-10-CM

## 2016-01-18 DIAGNOSIS — Z79899 Other long term (current) drug therapy: Secondary | ICD-10-CM

## 2016-01-18 DIAGNOSIS — G8929 Other chronic pain: Secondary | ICD-10-CM

## 2016-01-18 MED ORDER — GABAPENTIN 100 MG PO CAPS: ORAL_CAPSULE | ORAL | Status: DC

## 2016-01-18 MED ORDER — TERBINAFINE HCL 250 MG PO TABS: 250.0000 mg | ORAL_TABLET | Freq: Every day | ORAL | Status: DC

## 2016-01-18 NOTE — Patient Instructions (Signed)
Bar of [employer] in between sheets of bed--place at foot of bed.  Turn of TV every night between 10 and 11 p.m. No coffee or other source of caffeine after lunch Go to bed and get up at the same time. If you cannot fall asleep after 20 minutes, or you wake up and cannot get back to sleep in 20 minutes, get up and read one of your [employer] (or other sports books)  On second thought, reading about the [employer] may be exactly what you need to do to fall asleep! Keep walking and getting outside every day--good job  Spray your shoes with Lysol every time you wear them and continue to do so until you throw them out. Wash floor of shower twice weekly with bleach containing cleaner

## 2016-01-18 NOTE — Progress Notes (Signed)
   Subjective:    Patient ID: Parker Harvey, male    DOB: 09-04-1961, 55 y.o.   MRN: [id number]  HPI   1.  Chronic back pain:  Took Gabapentn 300 mg capsule in the morning daily for a month through the 20 th or so of January.  States he was out for a couple of days at one point as was moving and couldn't get to them.   Had some nausea initially as well as general weakness, but that resolved and patient did have some improvement in his back pain.  Felt he was able to do more things while taking the Gabapentin Has not heard anything from physical therapy  2.  Insomnia:  Stays up all night watching TV.  Drinks coffee and colas in the evening.  Walks outside daily.    3.  Muscle cramps in legs and feet, especially at night.  Tried the tonic water, but did not help.  Would be willing to try soap between the sheets.  4.  Darkening and pitting of ring finger nails with irritation of surrounding skin.  All of his toenails started this way and are crumbling and thick now.    5.  Dental and Eye referral:  Has not heard anything yet.        Review of Systems     Objective:   Physical Exam NAD Stands somewhat stiffly from chair.  Limited forward flexion.  Gait normal. Nails:  2 ring fingers with flaking and mild swelling about nail bed with darkened pitted nails.   Toenails diffusely with thickened discolored yellow nails.  Flaking also about nailbeds.      Assessment & Plan:  1.  Chronic back pain: Continue Diclofenac.  Not clear as to why he could not get gabapentin--thought this was not a [facility] program med.  [facility] to [facility].  Does sound like this helped.  Discussed he should take at bedtime to help with sleep. Will try to represcribe the 100 mg cap and titrate him up more slowly with intial nausea and fatigue. Checking on PT referral  2.  Insomnia:  Discussed needs significant change in sleep hygiene--the main one being to turn off the TV at 10 or 11  p.m. And go to bed. Has sports books to read.  3.  ONychomycosis:  Terbinafine 250 mg once daily for 90 days.  Shoe and shower floor treatment discussed.  4.  Muscle cramps--nocturnal mainly.  Try Ivory soap between sheets at foot of bed.

## 2016-01-19 LAB — COMPREHENSIVE METABOLIC PANEL
ALT: 26 IU/L (ref 0–44)
AST: 25 IU/L (ref 0–40)
Albumin/Globulin Ratio: 1.6 (ref 1.1–2.5)
Albumin: 4.2 g/dL (ref 3.5–5.5)
Alkaline Phosphatase: 64 IU/L (ref 39–117)
BUN/Creatinine Ratio: 9 (ref 9–20)
BUN: 11 mg/dL (ref 6–24)
Bilirubin Total: 0.4 mg/dL (ref 0.0–1.2)
CO2: 24 mmol/L (ref 18–29)
Calcium: 9.7 mg/dL (ref 8.7–10.2)
Chloride: 103 mmol/L (ref 96–106)
Creatinine, Ser: 1.23 mg/dL (ref 0.76–1.27)
GFR calc Af Amer: 78 mL/min/{1.73_m2} (ref >59)
GFR calc non Af Amer: 67 mL/min/{1.73_m2} (ref >59)
Globulin, Total: 2.7 g/dL (ref 1.5–4.5)
Glucose: 90 mg/dL (ref 65–99)
Potassium: 4.7 mmol/L (ref 3.5–5.2)
Sodium: 140 mmol/L (ref 134–144)
Total Protein: 6.9 g/dL (ref 6.0–8.5)

## 2016-02-29 ENCOUNTER — Ambulatory Visit: Payer: No Typology Code available for payment source | Admitting: Internal Medicine

## 2016-03-04 ENCOUNTER — Ambulatory Visit (INDEPENDENT_AMBULATORY_CARE_PROVIDER_SITE_OTHER): Payer: Self-pay | Admitting: Internal Medicine

## 2016-03-04 ENCOUNTER — Encounter: Payer: Self-pay | Admitting: Internal Medicine

## 2016-03-04 VITALS — BP 118/78 | HR 80 | Ht 62.5 in | Wt 150.0 lb

## 2016-03-04 DIAGNOSIS — K029 Dental caries, unspecified: Secondary | ICD-10-CM

## 2016-03-04 DIAGNOSIS — K047 Periapical abscess without sinus: Secondary | ICD-10-CM

## 2016-03-04 DIAGNOSIS — R361 Hematospermia: Secondary | ICD-10-CM

## 2016-03-04 MED ORDER — PENICILLIN V POTASSIUM 250 MG PO TABS: 250.0000 mg | ORAL_TABLET | Freq: Four times a day (QID) | ORAL | Status: DC

## 2016-03-04 MED ORDER — HYDROCODONE-ACETAMINOPHEN 5-325 MG PO TABS: 1.0000 | ORAL_TABLET | Freq: Four times a day (QID) | ORAL | Status: DC | PRN

## 2016-03-04 NOTE — Progress Notes (Signed)
   Subjective:    Patient ID: Parker Harvey, male    DOB: [birth year], 55 y.o.   MRN: [id number]  HPI   1.  Dental Pain:  Has had low grade pain for a while, but about 3 days ago developed significant pain and swelling above upper incisor on the left.  Has not had any drainage.  Is taking Diclofenac.  Was taking some OTC medication for back and also BC powders and Advil.   Has not been seen by Dentist since starting with us.  2.  Noted streak of blood in his ejaculate a little less than a week ago (with a dream).  Cannot recall the last time he had ejaculate to know if there was something normal recently.  Has not had since.  He woke up just as he was ejaculatiing and noted the blood streaking.  When he urinated, there was no blood--just light yellow urine.  No dysuria.  No pain with ejaculation.  No perineal pain, no flank pain.  No fever.  No urinary frequency.  No urinary hesitancy or decreased urine flow.  No history of prostatitis.  Meds:  Diclofenac 75 mg twice daily Gabapentin 100 mg --not taking for 2 days as concerned about blood in semen Terbinafine 250 mg daily--not taking  No Known Allergies Review of Systems     Objective:   Physical Exam  HEENT:  PERRL, EOMI, swelling of gum above upper left incisor, no fluctuance.  Terrible dental decay diffusely.  Throat without injection,  TMs pearly gray Neck:  Supple, no adenopathy Chest:  CTA CV:  RRR without murmur or rub, radial pulses normal and equal Abd:  S, NT, No HSM or mass, +BS GU and Rectal:  No genital lesions, No penile discharge or shaft tendernes, No testicular mass or tenderness.  Prostate mildly enlarged, but smooth and not boggy.  Pt. States somewhat tender.     Assessment & Plan:  1.  Hematospermia:  UA, PSA:  Pt. Could not give specimen today for UA, will return with specimen tomorrow.   2.  Dental decay abscessed tooth:  Pen VK 250 mg 4 times daily for 7 days and Hydrocodone/Acetaminophen 5 mg/325 mg #20 to  take every 6 hours for pain.  .  Urgent dental referral.

## 2016-03-05 LAB — POCT URINALYSIS DIPSTICK
BILIRUBIN UA: NEGATIVE
GLUCOSE UA: NEGATIVE
KETONES UA: NEGATIVE
Leukocytes, UA: NEGATIVE
Nitrite, UA: NEGATIVE
Protein, UA: NEGATIVE
RBC UA: NEGATIVE
SPEC GRAV UA: 1.02
UROBILINOGEN UA: 0.2
pH, UA: 5

## 2016-03-05 LAB — PSA: Prostate Specific Ag, Serum: 1.1 ng/mL (ref 0.0–4.0)

## 2016-04-02 ENCOUNTER — Ambulatory Visit: Payer: No Typology Code available for payment source | Admitting: Internal Medicine

## 2016-04-02 ENCOUNTER — Encounter: Payer: Self-pay | Admitting: Internal Medicine

## 2016-04-02 ENCOUNTER — Ambulatory Visit (INDEPENDENT_AMBULATORY_CARE_PROVIDER_SITE_OTHER): Payer: Self-pay | Admitting: Internal Medicine

## 2016-04-02 VITALS — BP 106/74 | HR 60 | Resp 16 | Ht 62.5 in | Wt 150.0 lb

## 2016-04-02 DIAGNOSIS — G8929 Other chronic pain: Secondary | ICD-10-CM

## 2016-04-02 DIAGNOSIS — J302 Other seasonal allergic rhinitis: Secondary | ICD-10-CM

## 2016-04-02 DIAGNOSIS — M549 Dorsalgia, unspecified: Secondary | ICD-10-CM

## 2016-04-02 MED ORDER — GABAPENTIN 100 MG PO CAPS: ORAL_CAPSULE | ORAL | Status: DC

## 2016-04-02 MED ORDER — CETIRIZINE HCL 10 MG PO TABS: 10.0000 mg | ORAL_TABLET | Freq: Every day | ORAL | Status: DC

## 2016-04-02 NOTE — Progress Notes (Signed)
   Subjective:    Patient ID: Parker Harvey, male    DOB: 1961/03/18, 55 y.o.   MRN: [id number]  HPI   1.  Chronic Low Back Pain:  Sleeps okay with Gabapentin--his wife's snoring really is what keeps him up at this point.  The 300 mg at bedtime has helped control more severe pain.  Still with shooting pains if sits too long or bends over.  Would like to garden, but then has back pain subsequently.  [facility]  Is really just plain bored--chews tobacco to pass the time.  Does get out an walk.  Wants to be outside.  Continues on Diclofenac as well:  Diclofenac definitely helps  2.  Dental decay:  Not severe pain as before.  Has appt. With dental clinic May 18th.  3.  Toenail Onychomycosis:  Never went and purchased Terbinafine--could not afford.    4.  Clear nasal discharge.  Has used unknown allergy meds, and some sort of unknown nasal spray.  Meds:   Diclofenac 75 mg twice daily Gabapentin 100 mg 3 caps at bedtime.  No Known Allergies   History  Smoking status  . Never Smoker   Smokeless tobacco  . Current User  . Types: Chew    Comment: still using dip twice daily.  Bored and chews.  Limited by back    Review of Systems     Objective:   Physical Exam HEENT:  PERRL, EOMI, conjunctivae mildly injected, TMs pearly gray, nasal mucosa swollen with clear discharge,  posterior pharynx with mild cobbling.  Severe dental decay. Neck:  Supple, no adenopathy Chest:  CTA CV:  RRR without murmur or rub, radial pulses normal and equal MS:  Tender over L/S spinous processes.   Neuro:  Motor 5/5, DTRs 2+/4 bilateral LE.  Stiff gait after sitting for short period.       Assessment & Plan:  1.  Chronic low back pain with radiculopathy:  Start morning dose of Gabapentin and titrate to 300 mg.  Continue evening dose of 300 mg.  Discussed may need to add another midday dose if not adequate control of pain.  See #3.  2.  Dental Decay:  Dental visit soon  3.  Tobacco  abuse:  Encouraged getting involved with gardening, raised beds, volunteering with orange card sign up, etc. Discussed he needs to recontact [facility] and see where they are with his scholarship application--should get into the pool  4.  Seasonal Allergies:  Zyrtec 10 mg daily.  To call in the nasal spray he has at home.  Avoid using if Afrin.

## 2016-04-02 NOTE — Patient Instructions (Addendum)
Can google "advance directives, [place]"  And bring up form from [place]. Print and fill out Or can go to "5 wishes"  Which is also in Spanish and fill out--this costs $5--perhaps easier to use. Designate a CytogeneticistMedical Power of Attorney to speak for you if you are unable to speak for yourself when ill or injured  Call in name of nasal spray you are using.

## 2016-06-10 ENCOUNTER — Other Ambulatory Visit: Payer: Self-pay | Admitting: Internal Medicine

## 2016-07-10 ENCOUNTER — Telehealth: Payer: Self-pay | Admitting: Internal Medicine

## 2016-07-10 NOTE — Telephone Encounter (Signed)
Patient called requesting medication refill for diclofenac (VOLTAREN) 75 MG EC tablet . Please send to [facility] at [facility]

## 2016-07-11 MED ORDER — DICLOFENAC SODIUM 75 MG PO TBEC: 75.0000 mg | DELAYED_RELEASE_TABLET | Freq: Two times a day (BID) | ORAL | 0 refills | Status: DC

## 2016-07-11 NOTE — Telephone Encounter (Signed)
rx sent

## 2016-08-04 ENCOUNTER — Ambulatory Visit (INDEPENDENT_AMBULATORY_CARE_PROVIDER_SITE_OTHER): Payer: No Typology Code available for payment source | Admitting: Internal Medicine

## 2016-08-04 ENCOUNTER — Encounter: Payer: Self-pay | Admitting: Internal Medicine

## 2016-08-04 DIAGNOSIS — G8929 Other chronic pain: Secondary | ICD-10-CM

## 2016-08-04 DIAGNOSIS — M545 Low back pain: Secondary | ICD-10-CM

## 2016-08-04 DIAGNOSIS — K029 Dental caries, unspecified: Secondary | ICD-10-CM

## 2016-08-04 MED ORDER — METAXALONE 400 MG PO TABS: ORAL_TABLET | ORAL | 0 refills | Status: DC

## 2016-08-04 NOTE — Patient Instructions (Signed)
Brush your teeth twice daily and floss daily before you brush your teeth at bedtime

## 2016-08-04 NOTE — Progress Notes (Signed)
   Subjective:    Patient ID: Parker Harvey, male    DOB: 05-22-1961, 55 y.o.   MRN: [id number]  HPI   1.  Constant swelling and pain of gums in past 2 weeks.  Has improved in past 2 days.  Cancelled appt. Last week with dental as had a death in the family.  Has rescheduled.  Really, not having any pain today.  No fever.  2.  Chronic back pain:  Last week, had a terrible back pain -- couldn't move due to the pain.  Lasted about 20 minutes.  Stabbing pain across low back.  No injury or overuse in the days preceding.  Current Meds  Medication Sig  . cetirizine (ZYRTEC) 10 MG tablet Take 1 tablet (10 mg total) by mouth daily.  . diclofenac (VOLTAREN) 75 MG EC tablet Take 1 tablet (75 mg total) by mouth 2 (two) times daily with a meal.  . gabapentin (NEURONTIN) 100 MG capsule 3 caps by mouth in morning and 3 caps by mouth before bedteim  . ibuprofen (ADVIL,MOTRIN) 200 MG tablet Take 800 mg by mouth every 8 (eight) hours as needed for moderate pain.   No Known Allergies   Review of Systems     Objective:   Physical Exam   HEENT:  Periodontal disease, throat without injection,  Neck:  Supple, no adenopathy Chest:  CTA CV:  RRR without murmur or rub, radial pulses normal and equal MS/Neuro exam: unchanged.        Assessment & Plan:  1.  Dental disease:  Has appt. With dental clinic rescheduled.  Encouraged to floss daily and brush twice daily.  2.  Chronic Back pain:  Not interested in increasing Gabapentin.  Willing to try a different muscle relaxant.  Metaxolone 400 mg 1-2 tabs twice daily as needed.  To call if too expensive. Not to take both ibuprofen and Diclofenac, just one or the other.  To take with food.

## 2016-08-15 ENCOUNTER — Other Ambulatory Visit: Payer: Self-pay | Admitting: Licensed Clinical Social Worker

## 2016-08-19 ENCOUNTER — Telehealth: Payer: Self-pay | Admitting: Internal Medicine

## 2016-08-19 NOTE — Telephone Encounter (Signed)
Is there a substitute for this?

## 2016-08-19 NOTE — Telephone Encounter (Signed)
Patient called stating metaxalone (SKELAXIN) 400 MG tablet was over $100.00 and he cannot afford to pay that amount and would like to [facility] something [facility]

## 2016-08-20 MED ORDER — CYCLOBENZAPRINE HCL 5 MG PO TABS: ORAL_TABLET | ORAL | 0 refills | Status: DC

## 2016-08-20 NOTE — Telephone Encounter (Signed)
Let him know I sent [facility]aprine to [facility].  Should be around $4-10

## 2016-08-20 NOTE — Telephone Encounter (Signed)
Unable to reach patient by phone today.

## 2016-08-29 ENCOUNTER — Other Ambulatory Visit: Payer: Self-pay | Admitting: Internal Medicine

## 2016-08-29 NOTE — Telephone Encounter (Signed)
No correct number in the patient chart to call.

## 2016-10-02 ENCOUNTER — Other Ambulatory Visit: Payer: Self-pay | Admitting: Internal Medicine

## 2016-10-02 ENCOUNTER — Telehealth: Payer: Self-pay | Admitting: Internal Medicine

## 2016-10-02 NOTE — Telephone Encounter (Signed)
Patient called to ask Dr. Delrae AlfredMulberry if the pain he is having on his lower back is due drinking coffee everyday in the morning. Please advise. Patient has FU in January

## 2016-10-10 NOTE — Telephone Encounter (Signed)
No, I would not think that would cause his low back pain.

## 2016-10-13 NOTE — Telephone Encounter (Signed)
Phone # not accepting calls at this moment.

## 2016-10-14 ENCOUNTER — Telehealth: Payer: Self-pay

## 2016-10-14 NOTE — Telephone Encounter (Signed)
[facility] for DOS May 30, 1024 to present. Pt last seen here on 05/29/2014. No records to be released as requested.   LM for Rosey Batheresa with Med Rec Department to [facility]

## 2016-11-05 ENCOUNTER — Other Ambulatory Visit: Payer: Self-pay | Admitting: Internal Medicine

## 2017-01-01 ENCOUNTER — Ambulatory Visit: Payer: Self-pay | Admitting: Internal Medicine

## 2017-01-07 ENCOUNTER — Other Ambulatory Visit: Payer: Self-pay

## 2017-01-07 DIAGNOSIS — G8929 Other chronic pain: Secondary | ICD-10-CM

## 2017-01-07 DIAGNOSIS — M549 Dorsalgia, unspecified: Principal | ICD-10-CM

## 2017-01-07 MED ORDER — GABAPENTIN 100 MG PO CAPS: ORAL_CAPSULE | ORAL | 6 refills | Status: DC

## 2017-02-03 ENCOUNTER — Ambulatory Visit: Payer: Self-pay | Admitting: Internal Medicine

## 2017-02-10 ENCOUNTER — Ambulatory Visit: Payer: Self-pay | Admitting: Internal Medicine

## 2017-02-17 ENCOUNTER — Other Ambulatory Visit: Payer: Self-pay | Admitting: Internal Medicine

## 2017-04-23 ENCOUNTER — Ambulatory Visit (INDEPENDENT_AMBULATORY_CARE_PROVIDER_SITE_OTHER): Payer: Self-pay | Admitting: Internal Medicine

## 2017-04-23 ENCOUNTER — Encounter: Payer: Self-pay | Admitting: Internal Medicine

## 2017-04-23 VITALS — BP 112/78 | HR 76 | Resp 12 | Ht 63.0 in | Wt 156.0 lb

## 2017-04-23 DIAGNOSIS — M549 Dorsalgia, unspecified: Secondary | ICD-10-CM

## 2017-04-23 DIAGNOSIS — K029 Dental caries, unspecified: Secondary | ICD-10-CM

## 2017-04-23 DIAGNOSIS — M25462 Effusion, left knee: Secondary | ICD-10-CM

## 2017-04-23 DIAGNOSIS — G8929 Other chronic pain: Secondary | ICD-10-CM

## 2017-04-23 MED ORDER — DICLOFENAC SODIUM 75 MG PO TBEC: 75.0000 mg | DELAYED_RELEASE_TABLET | Freq: Two times a day (BID) | ORAL | 2 refills | Status: DC

## 2017-04-23 MED ORDER — CYCLOBENZAPRINE HCL 5 MG PO TABS: ORAL_TABLET | ORAL | 0 refills | Status: DC

## 2017-04-23 NOTE — Progress Notes (Signed)
   Subjective:    Patient ID: Parker Harvey, male    DOB: 12-28-1960, 56 y.o.   MRN: [id number]  HPI   1.  Dental concerns:  Did go to dentist.  Had Xrays.  Cleaned teeth and sent to a specialist to have some teeth removed that could not get done at the dental clinic. Has been unable to afford this dentist, even the $30. Gums bleed when he brushes.  Gums swollen again. Would like to just get all his teeth pulled and then to get dentures. Wife has had hip replacement and subsequent infection.  Aunt they live with was in hospital with a stroke and in hospital for 5 months, now requiring lots of care at home. Taking Ibuprofen for dental pain along with his Diclofenac  2.  Chronic Back Pain:  Not doing exercises given from PT.  [facility] and would like an Rx for Cyclobenzaprine when he has a bad day.  Willing to titrate up on Gabapentin.  3.  Left knee pain popping and swelling at times.  Uses a cane already.  Current Meds  Medication Sig  . diclofenac (VOLTAREN) 75 MG EC tablet TAKE ONE TABLET BY MOUTH TWICE DAILY WITH A MEAL  . gabapentin (NEURONTIN) 100 MG capsule 3 caps by mouth in morning and 3 caps by mouth before bedteim  . ibuprofen (ADVIL,MOTRIN) 200 MG tablet Take 800 mg by mouth every 8 (eight) hours as needed for moderate pain.   No Known Allergies    Review of Systems     Objective:   Physical Exam NAD HEENT:  Dental decay--teeth broken at gumline with diffuse periodontal disease as well. Neck:  Supple, No adenopathy. Chest:  CTA CV:  RRR without murmur or rub, radial pulses normal and equal Back:  Tender over paraspinous musculature.  Exam unchanged. Left knee with mildly decreased flexion, full extension.  NT on stress maneuvers of cruciates and collateral ligaments.  NT over medial and lateral joint lines.       Assessment & Plan:  1.  Back and left knee pain:  Discussed only to take one antiinflammatory medication.  Diclofenac works best  per patient.  Stop Ibuprofen.  Cyclobenazaprine for bad days with back pain. Titrate up to 600 mg of gabapentin at bedtime.  Maintain 300 mg in morning.  Once on those doses, switch to 300 mg caps.  2.  Dental decay:  Will await when patient feels he can afford the $30 clinic visit to have more work done.   Floss daily

## 2017-04-23 NOTE — Patient Instructions (Addendum)
Keep morning dose of Gabapentin to 3 capsules. Tonight increase evening dose to 4 caps On Sunday, increase to 5 caps and next Wednesday increase to 6 caps. When you come in for your fasting labs in 2 weeks, let Cherice know how you are doing with that dosing and I will change you to the 300 mg caps if you are doing okay.

## 2017-04-27 ENCOUNTER — Telehealth: Payer: Self-pay | Admitting: Internal Medicine

## 2017-04-27 NOTE — Telephone Encounter (Signed)
Error

## 2017-05-06 ENCOUNTER — Other Ambulatory Visit (INDEPENDENT_AMBULATORY_CARE_PROVIDER_SITE_OTHER): Payer: Self-pay

## 2017-05-06 DIAGNOSIS — Z79899 Other long term (current) drug therapy: Secondary | ICD-10-CM

## 2017-05-06 DIAGNOSIS — Z13220 Encounter for screening for lipoid disorders: Secondary | ICD-10-CM

## 2017-05-07 LAB — COMPREHENSIVE METABOLIC PANEL
A/G RATIO: 1.4 (ref 1.2–2.2)
ALT: 26 IU/L (ref 0–44)
AST: 25 IU/L (ref 0–40)
Albumin: 4.3 g/dL (ref 3.5–5.5)
Alkaline Phosphatase: 63 IU/L (ref 39–117)
BUN/Creatinine Ratio: 10 (ref 9–20)
BUN: 12 mg/dL (ref 6–24)
Bilirubin Total: 0.5 mg/dL (ref 0.0–1.2)
CALCIUM: 9.6 mg/dL (ref 8.7–10.2)
CHLORIDE: 103 mmol/L (ref 96–106)
CO2: 23 mmol/L (ref 18–29)
Creatinine, Ser: 1.25 mg/dL (ref 0.76–1.27)
GFR calc Af Amer: 75 mL/min/{1.73_m2} (ref >59)
GFR, EST NON AFRICAN AMERICAN: 65 mL/min/{1.73_m2} (ref >59)
GLOBULIN, TOTAL: 3 g/dL (ref 1.5–4.5)
Glucose: 85 mg/dL (ref 65–99)
Potassium: 4.7 mmol/L (ref 3.5–5.2)
SODIUM: 142 mmol/L (ref 134–144)
Total Protein: 7.3 g/dL (ref 6.0–8.5)

## 2017-05-07 LAB — CBC WITH DIFFERENTIAL/PLATELET
Basophils Absolute: 0 x10E3/uL (ref 0.0–0.2)
Basos: 0 %
EOS (ABSOLUTE): 0.2 x10E3/uL (ref 0.0–0.4)
Eos: 2 %
Hematocrit: 40.8 % (ref 37.5–51.0)
Hemoglobin: 12.5 g/dL — ABNORMAL LOW (ref 13.0–17.7)
Immature Grans (Abs): 0 x10E3/uL (ref 0.0–0.1)
Immature Granulocytes: 0 %
Lymphocytes Absolute: 3 x10E3/uL (ref 0.7–3.1)
Lymphs: 40 %
MCH: 28.3 pg (ref 26.6–33.0)
MCHC: 30.6 g/dL — ABNORMAL LOW (ref 31.5–35.7)
MCV: 92 fL (ref 79–97)
Monocytes Absolute: 0.6 x10E3/uL (ref 0.1–0.9)
Monocytes: 8 %
Neutrophils Absolute: 3.7 x10E3/uL (ref 1.4–7.0)
Neutrophils: 50 %
Platelets: 263 x10E3/uL (ref 150–379)
RBC: 4.42 x10E6/uL (ref 4.14–5.80)
RDW: 13.9 % (ref 12.3–15.4)
WBC: 7.4 x10E3/uL (ref 3.4–10.8)

## 2017-05-07 LAB — LIPID PANEL W/O CHOL/HDL RATIO
Cholesterol, Total: 215 mg/dL — ABNORMAL HIGH (ref 100–199)
HDL: 51 mg/dL
LDL Calculated: 124 mg/dL — ABNORMAL HIGH (ref 0–99)
Triglycerides: 201 mg/dL — ABNORMAL HIGH (ref 0–149)
VLDL Cholesterol Cal: 40 mg/dL (ref 5–40)

## 2017-05-12 ENCOUNTER — Other Ambulatory Visit: Payer: Self-pay

## 2017-05-12 MED ORDER — GABAPENTIN 300 MG PO CAPS
300.0000 mg | ORAL_CAPSULE | Freq: Two times a day (BID) | ORAL | 11 refills | Status: DC
Start: 2017-05-12 — End: 2017-11-30

## 2017-07-15 ENCOUNTER — Telehealth: Payer: Self-pay | Admitting: Internal Medicine

## 2017-07-15 NOTE — Telephone Encounter (Signed)
Patient called stating needs antibiotics has few days with sore gum.

## 2017-07-15 NOTE — Telephone Encounter (Signed)
Called all 3 numbers listed in chart and none of them are working numbers.

## 2017-07-20 ENCOUNTER — Telehealth: Payer: Self-pay | Admitting: Internal Medicine

## 2017-07-20 NOTE — Telephone Encounter (Signed)
Patient went to pick up Rx and the charge was higher than he usually pays.  Rx is now $19.00 and was $4.00. [phone].  Rx needed is diclofenac (VOLTAREN) 75 mg.

## 2017-07-21 ENCOUNTER — Other Ambulatory Visit: Payer: Self-pay

## 2017-07-21 MED ORDER — DICLOFENAC SODIUM 75 MG PO TBEC: 75.0000 mg | DELAYED_RELEASE_TABLET | Freq: Two times a day (BID) | ORAL | 1 refills | Status: DC

## 2017-07-22 NOTE — Telephone Encounter (Signed)
Rx sent to health department. Mrs. Pat called patient to inform him. States [facility].

## 2017-07-24 ENCOUNTER — Ambulatory Visit: Payer: Self-pay | Admitting: Internal Medicine

## 2017-08-20 ENCOUNTER — Telehealth: Payer: Self-pay | Admitting: Internal Medicine

## 2017-08-20 NOTE — Telephone Encounter (Signed)
Patient called stating needs a Rx on Diclofenac ( Voltaren) 75  Mg.  To Cherice for further action

## 2017-08-20 NOTE — Telephone Encounter (Signed)
Rx was sent to the health department last month. Need accurate phone number for patient

## 2017-08-31 ENCOUNTER — Ambulatory Visit: Payer: Self-pay | Admitting: Internal Medicine

## 2017-09-08 ENCOUNTER — Encounter: Payer: Self-pay | Admitting: Internal Medicine

## 2017-09-08 ENCOUNTER — Ambulatory Visit (INDEPENDENT_AMBULATORY_CARE_PROVIDER_SITE_OTHER): Payer: Self-pay | Admitting: Internal Medicine

## 2017-09-08 VITALS — BP 124/84 | HR 76 | Resp 12 | Ht 63.0 in | Wt 159.0 lb

## 2017-09-08 DIAGNOSIS — Z0000 Encounter for general adult medical examination without abnormal findings: Secondary | ICD-10-CM

## 2017-09-08 MED ORDER — NAPROXEN 500 MG PO TABS: 500.0000 mg | ORAL_TABLET | Freq: Two times a day (BID) | ORAL | 11 refills | Status: DC

## 2017-09-08 MED ORDER — GABAPENTIN 100 MG PO CAPS: ORAL_CAPSULE | ORAL | 6 refills | Status: DC

## 2017-09-08 NOTE — Patient Instructions (Addendum)
Can google "advance directives, [place]"  And bring up form from Secretary of [place]. Print and fill out Or can go to "5 wishes"  Which is also in Spanish and fill out--this costs $5--perhaps easier to use. Designate a Cytogeneticist to speak for you if you are unable to speak for yourself when ill or injured   Call when you are ready to try the pill for your feet and nails.  It costs 10 dollars at [employer] Try Tea Tree oil on your feet at night.  Ivory soap at the foot of bed either in between sheets or under fitted sheet.  Cut away fuzziness as develops  For the Gabapentin:  Take 300 mg caps twice daily.  Add one 100 mg cap at bedtime to the 300 mg cap.  In 3-7 days add another 100 mg cap to make 500 mg, in another 3-7 days, add a 3rd 100 mg cap to total 600 mg at bedtime.  If you continue to have a lot of pain, do the same increase with the morning dose. Once you are taking 600 mg twice daily, let me know and will change you to two 300 mg caps twice daily.

## 2017-09-08 NOTE — Progress Notes (Signed)
Subjective:    Patient ID: Parker Harvey, male    DOB: 1961/07/08, 56 y.o.   MRN: [id number]  HPI   Here for Male CPE:  1.  STE:  Does not perform regularly.  No family history.  2.  PSA/DRE: PSA last checked 02/13/2016 and 1.1.  Prostate exam then as well.  No family history of prostate cancer.  3.  Guaiac Cards:  Not sure.    4.  Colonoscopy: Underwent with Dr. Loreta Ave in 2010 and was normal.  No family history of colon cancer.  States a sister with history of cancer, but not aware of primary.  5.  Cholesterol/Glucose:  History of hyperlipidemia.  History of elevated glucose  6.  Immunizations:  Immunization History  Administered Date(s) Administered  . Influenza,inj,Quad PF,6+ Mos 08/18/2013  . Influenza-Unspecified 11/23/2015  . Tdap 12/15/2008   Current Meds  Medication Sig  . gabapentin (NEURONTIN) 300 MG capsule Take 1 capsule (300 mg total) by mouth 2 (two) times daily.  . [DISCONTINUED] diclofenac (VOLTAREN) 75 MG EC tablet Take 1 tablet (75 mg total) by mouth 2 (two) times daily with a meal.   No Known Allergies   Past Medical History:  Diagnosis Date  . 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  Previously followed by Dr. Murray Hodgkins for chronic pain.  Epidural injections help, but pt. concernd with having too many and they hurt.  . Dental decay   . Hyperlipidemia 2017  . Presbyopia    Past Surgical History:  Procedure Laterality Date  . BUNIONECTOMY Left 2005   [facility]  . low back surgery  2012   Dr. Wynetta Emery:  L4-5 and L5-S1 decompression and stabilization surgery   Family History  Problem Relation Age of Onset  . Heart disease Father 45       MI-cause of death  . Hypertension Father   . Stroke Father   . Hypertension Mother   . Diabetes Mother   . Heart disease Mother        MI cause of death  . Hypertension Sister        1 sister with hypertension  . Cancer Sister        2 sisters with  breast cancer  . Hypertension Brother        1 brother with hypertension  . Heart disease Brother        Brother with hypertension has had 2 MIs and CABG    Social History   Social History  . Marital status: Married    Spouse name: Parker Harvey  . Number of children: 3  . Years of education: 10   Occupational History  . Custodian at [employer]    Social History Main Topics  . Smoking status: Never Smoker  . Smokeless tobacco: Current User    Types: Chew     Comment: still using dip twice daily.  Bored and chews.  Limited by back  . Alcohol use 0.0 oz/week     Comment: Beer 2-3 in a weekend  . Drug use: No  . Sexual activity: Yes     Comment: wife post menopausal   Other Topics Concern  . Not on file   Social History Narrative   [place] [address] with aunt, wife, 2 young grandchildren.  Mother of children lives on [place].   Has 3 children he considers his own, but are biologically  his wife's and her previous partner's   Unable to work due to chronic back issues.         Review of Systems  Constitutional: Negative for fatigue, fever and unexpected weight change.  HENT: Positive for dental problem (Has been to dentist, but no income, so cannot return.  Having some swelling on hard palate). Negative for ear pain, hearing loss, sinus pain, sinus pressure, sneezing and sore throat.   Eyes: Positive for visual disturbance (has reading glasses and feels they work well for him.).  Respiratory: Positive for shortness of breath (with jogging.  ).   Cardiovascular: Negative for chest pain, palpitations and leg swelling.  Gastrointestinal: Negative for abdominal pain, blood in stool (no melena), diarrhea and nausea.  Genitourinary: Positive for frequency (nocturia, but drinks a lot of water daily and before goes to bed.). Negative for dysuria and testicular pain.       No urinary hesitation.  Good urinary stream. No further hematospermia    Musculoskeletal:       Chronic knee and back problems Foot cramps  Neurological: Negative for dizziness, weakness and numbness.  Hematological: Negative for adenopathy. Does not bruise/bleed easily.  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        Objective:   Physical Exam  Constitutional: He is oriented to person, place, and time. He appears well-developed and well-nourished.  HENT:  Head: Normocephalic and atraumatic.  Right Ear: Hearing, tympanic membrane, external ear and ear canal normal.  Left Ear: Hearing, tympanic membrane, external ear and ear canal normal.  Nose: Nose normal.  Mouth/Throat: Uvula is midline, oropharynx is clear and moist and mucous membranes are normal. Abnormal dentition (poor dentition with breakage of teeth, cavities and more lost teeth than teeth present.).    Eyes: Pupils are equal, round, and reactive to light. Conjunctivae and EOM are normal.  Discs sharp bilaterally  Neck: Normal range of motion and full passive range of motion without pain. Neck supple. No thyromegaly present.  Cardiovascular: Normal rate, regular rhythm, S1 normal and S2 normal.  Exam reveals no S3, no S4 and no friction rub.   No murmur heard. No carotid bruits.  Carotid, radial, femoral, DP and PT pulses normal and equal.   Pulmonary/Chest: Effort normal and breath sounds normal.  Abdominal: Soft. Bowel sounds are normal. He exhibits no mass. There is no hepatosplenomegaly. There is no tenderness. Hernia confirmed negative in the right inguinal area and confirmed negative in the left inguinal area.  Fingertip reducible umbilical hernia.  Genitourinary: Rectum normal, testes normal and penis normal. Rectal exam shows no mass, anal tone normal and guaiac negative stool.  Musculoskeletal: Normal range of motion.  Lymphadenopathy:       Head (right side): No submental and no submandibular adenopathy present.       Head  (left side): No submental and no submandibular adenopathy present.    He has no cervical adenopathy.    He has no axillary adenopathy.       Right: No inguinal and no supraclavicular adenopathy present.       Left: No inguinal and no supraclavicular adenopathy present.  Neurological: He is alert and oriented to person, place, and time. He has normal strength and normal reflexes. He displays normal reflexes. No cranial nerve deficit or sensory deficit. Coordination and gait normal.  Skin: Skin is warm.  Genital area with areas of ashiness and pubic hair loss.   Feet with thickened flaking areas  over heels and plantar areas Toenails brown orange and breaking/thickened.  States using topical Lamisil. Not clear if better.  Psychiatric: He has a normal mood and affect. His speech is normal and behavior is normal. Judgment and thought content normal. Cognition and memory are normal.          Assessment & Plan:  1.  CPE Fasting labs in 1 week:  FLP, CMP, CBC.  Has normal PSA last year.  2.  Toenail onychomycosis,tinea pedis and tinea cruris:  To use topical Terbinafine twice daily as well as tea tree oil for foot involvement.  He is not interested in oral Terbinafine for 90 day treatment at this time.  3.  Leg cramps:  Ivory soap between sheets at foot of bed.  4.  Chronic low back pain:  Titrate up on Gabapentin to see if better control of pain.  Max 600 mg twice daily.

## 2017-09-17 ENCOUNTER — Other Ambulatory Visit (INDEPENDENT_AMBULATORY_CARE_PROVIDER_SITE_OTHER): Payer: Self-pay

## 2017-09-17 DIAGNOSIS — Z1211 Encounter for screening for malignant neoplasm of colon: Secondary | ICD-10-CM

## 2017-09-17 LAB — POC HEMOCCULT BLD/STL (HOME/3-CARD/SCREEN)
Card #3 Fecal Occult Blood, POC: NEGATIVE
FECAL OCCULT BLD: NEGATIVE
Fecal Occult Blood, POC: NEGATIVE

## 2017-09-22 ENCOUNTER — Other Ambulatory Visit (INDEPENDENT_AMBULATORY_CARE_PROVIDER_SITE_OTHER): Payer: Self-pay

## 2017-09-22 DIAGNOSIS — Z13220 Encounter for screening for lipoid disorders: Secondary | ICD-10-CM

## 2017-09-22 DIAGNOSIS — Z0000 Encounter for general adult medical examination without abnormal findings: Secondary | ICD-10-CM

## 2017-09-23 LAB — CBC WITH DIFFERENTIAL/PLATELET
BASOS ABS: 0 10*3/uL (ref 0.0–0.2)
BASOS: 0 %
EOS (ABSOLUTE): 0.1 10*3/uL (ref 0.0–0.4)
Eos: 2 %
Hematocrit: 40.5 % (ref 37.5–51.0)
Hemoglobin: 13.4 g/dL (ref 13.0–17.7)
IMMATURE GRANS (ABS): 0 10*3/uL (ref 0.0–0.1)
IMMATURE GRANULOCYTES: 0 %
LYMPHS: 41 %
Lymphocytes Absolute: 2.4 10*3/uL (ref 0.7–3.1)
MCH: 28.7 pg (ref 26.6–33.0)
MCHC: 33.1 g/dL (ref 31.5–35.7)
MCV: 87 fL (ref 79–97)
MONOS ABS: 0.7 10*3/uL (ref 0.1–0.9)
Monocytes: 12 %
NEUTROS PCT: 45 %
Neutrophils Absolute: 2.6 10*3/uL (ref 1.4–7.0)
PLATELETS: 276 10*3/uL (ref 150–379)
RBC: 4.67 x10E6/uL (ref 4.14–5.80)
RDW: 14.5 % (ref 12.3–15.4)
WBC: 5.9 10*3/uL (ref 3.4–10.8)

## 2017-09-23 LAB — COMPREHENSIVE METABOLIC PANEL
A/G RATIO: 1.6 (ref 1.2–2.2)
ALK PHOS: 74 IU/L (ref 39–117)
ALT: 26 IU/L (ref 0–44)
AST: 27 IU/L (ref 0–40)
Albumin: 4.5 g/dL (ref 3.5–5.5)
BUN/Creatinine Ratio: 13 (ref 9–20)
BUN: 17 mg/dL (ref 6–24)
Bilirubin Total: 0.8 mg/dL (ref 0.0–1.2)
CALCIUM: 9.7 mg/dL (ref 8.7–10.2)
CHLORIDE: 106 mmol/L (ref 96–106)
CO2: 21 mmol/L (ref 20–29)
Creatinine, Ser: 1.36 mg/dL — ABNORMAL HIGH (ref 0.76–1.27)
GFR calc Af Amer: 68 mL/min/{1.73_m2} (ref >59)
GFR, EST NON AFRICAN AMERICAN: 59 mL/min/{1.73_m2} — AB (ref >59)
Globulin, Total: 2.8 g/dL (ref 1.5–4.5)
Glucose: 107 mg/dL — ABNORMAL HIGH (ref 65–99)
POTASSIUM: 4.9 mmol/L (ref 3.5–5.2)
SODIUM: 141 mmol/L (ref 134–144)
Total Protein: 7.3 g/dL (ref 6.0–8.5)

## 2017-09-23 LAB — LIPID PANEL W/O CHOL/HDL RATIO
CHOLESTEROL TOTAL: 203 mg/dL — AB (ref 100–199)
HDL: 42 mg/dL (ref >39)
LDL Calculated: 112 mg/dL — ABNORMAL HIGH (ref 0–99)
Triglycerides: 246 mg/dL — ABNORMAL HIGH (ref 0–149)
VLDL CHOLESTEROL CAL: 49 mg/dL — AB (ref 5–40)

## 2017-09-28 ENCOUNTER — Telehealth: Payer: Self-pay | Admitting: Internal Medicine

## 2017-09-28 NOTE — Telephone Encounter (Signed)
Patient calling as he was requested to call when he is about to complete Rx diclofenac .  Patient was told to go to naproxyn 500 mg Wants this medicine called into [facility] at [facility].

## 2017-09-29 NOTE — Telephone Encounter (Signed)
To Dr. Mulberry for further direction 

## 2017-09-29 NOTE — Telephone Encounter (Signed)
I already sent the Rx for Naproxen.  He has many refills.

## 2017-09-30 NOTE — Telephone Encounter (Signed)
Spoke with patient wife. Informed to have patient stay at 2 instead of taking 3. Patient verbalized understanding

## 2017-09-30 NOTE — Telephone Encounter (Signed)
Patient called to inform doctor that the increase in gabapentin is causing him to become weak and dizzy.  Patient has stopped taking increase in gabapentin.

## 2017-10-22 ENCOUNTER — Telehealth: Payer: Self-pay | Admitting: Internal Medicine

## 2017-10-22 NOTE — Telephone Encounter (Signed)
Patient called stating will like to see if will possible to transfer naproxen (NAPROSYN) 500 MG tablet to [facility]; Patient stated he thinks can get it   cheaper there than [facility]. Please advise.

## 2017-10-23 MED ORDER — NAPROXEN 500 MG PO TABS: 500.0000 mg | ORAL_TABLET | Freq: Two times a day (BID) | ORAL | 11 refills | Status: DC

## 2017-10-23 NOTE — Telephone Encounter (Signed)
[facility] Patient informed

## 2017-10-29 ENCOUNTER — Telehealth: Payer: Self-pay | Admitting: Internal Medicine

## 2017-10-29 NOTE — Telephone Encounter (Signed)
Patient called stating naproxen (NAPROSYN) 500 MG tablet is causing short of breath. Patient stated this is being going on for  4 or 5 days. Please advise.

## 2017-10-30 NOTE — Telephone Encounter (Signed)
Spoke with patient. States Dr. Delrae AlfredMulberry put him on Naproxen in place of gabapentin. States he is now feeling like the naproxen is cutting off his wind and making him short of breath even when he is sitting down resting he felt short of breath. Patient states he did not take it yesterday or today and he is feeling better. Patient is aware Dr. Delrae AlfredMulberry is out of the office until Monday and we will call him on Monday and let him know what she wants him to do.

## 2017-11-02 ENCOUNTER — Other Ambulatory Visit: Payer: Self-pay

## 2017-11-02 DIAGNOSIS — Z79899 Other long term (current) drug therapy: Secondary | ICD-10-CM

## 2017-11-02 NOTE — Telephone Encounter (Signed)
He was not to take Naproxen in place of Gabapentin, it was in place of Diclofenac See if he is still taking the Gabapentin.   I would like to try to gradually increase that--much more slowly than I instructed previously See if he has ever taken Meloxicam as we could try that instead of the Naproxen.

## 2017-11-03 LAB — BASIC METABOLIC PANEL
BUN / CREAT RATIO: 9 (ref 9–20)
BUN: 13 mg/dL (ref 6–24)
CHLORIDE: 102 mmol/L (ref 96–106)
CO2: 23 mmol/L (ref 20–29)
Calcium: 9.9 mg/dL (ref 8.7–10.2)
Creatinine, Ser: 1.42 mg/dL — ABNORMAL HIGH (ref 0.76–1.27)
GFR calc Af Amer: 64 mL/min/{1.73_m2} (ref >59)
GFR calc non Af Amer: 56 mL/min/{1.73_m2} — ABNORMAL LOW (ref >59)
Glucose: 122 mg/dL — ABNORMAL HIGH (ref 65–99)
Potassium: 4.6 mmol/L (ref 3.5–5.2)
SODIUM: 141 mmol/L (ref 134–144)

## 2017-11-04 ENCOUNTER — Telehealth: Payer: Self-pay | Admitting: Internal Medicine

## 2017-11-04 MED ORDER — MELOXICAM 15 MG PO TABS: ORAL_TABLET | ORAL | 11 refills | Status: DC

## 2017-11-04 NOTE — Telephone Encounter (Signed)
Patient informed. 

## 2017-11-04 NOTE — Telephone Encounter (Signed)
Spoke with patient. States yes he is still taking the gabapentin. Patient states he has never tried Meloxicam.

## 2017-11-04 NOTE — Telephone Encounter (Signed)
See if he would try adding another capsule of Gabapentin at night again and see if goes better this time. Also, sending in Rx for Meloxicam 15 mg once daily and will see if helpful without the side effects.

## 2017-11-04 NOTE — Telephone Encounter (Signed)
Spoke with patient. Informed to take medication as directed dur to kidney functions. Patient verbalized understanding. Patient also states he will try going back up on gabapentin at night to 3 pills. [facility]

## 2017-11-30 ENCOUNTER — Telehealth: Payer: Self-pay | Admitting: Internal Medicine

## 2017-11-30 ENCOUNTER — Other Ambulatory Visit: Payer: Self-pay

## 2017-11-30 MED ORDER — GABAPENTIN 100 MG PO CAPS: ORAL_CAPSULE | ORAL | 11 refills | Status: DC

## 2017-11-30 NOTE — Telephone Encounter (Signed)
Patient called, Parker Bibleat spoke with nurse who will research the Rx regarding gabapentin 100 and 300 mg.

## 2017-11-30 NOTE — Telephone Encounter (Signed)
Spoke with patient is taking gabapentin 100 mg 2 in the am and 3 at bedtime. Per patient any time he goes up on it he feels extremely tired and would like to stay on current dose. Per Dr. Delrae AlfredMulberry she is okay with that. Patient states medication is helping. Per Dr. Delrae AlfredMulberry okay. Rx sent to pharmacy. Patient is aware. Rx for the 300 mg was discontinued

## 2017-12-04 ENCOUNTER — Other Ambulatory Visit: Payer: Self-pay

## 2017-12-25 ENCOUNTER — Other Ambulatory Visit: Payer: Self-pay

## 2017-12-25 DIAGNOSIS — Z79899 Other long term (current) drug therapy: Secondary | ICD-10-CM

## 2017-12-25 DIAGNOSIS — R739 Hyperglycemia, unspecified: Secondary | ICD-10-CM

## 2017-12-26 LAB — BASIC METABOLIC PANEL
BUN / CREAT RATIO: 11 (ref 9–20)
BUN: 14 mg/dL (ref 6–24)
CHLORIDE: 103 mmol/L (ref 96–106)
CO2: 23 mmol/L (ref 20–29)
Calcium: 9.9 mg/dL (ref 8.7–10.2)
Creatinine, Ser: 1.3 mg/dL — ABNORMAL HIGH (ref 0.76–1.27)
GFR calc Af Amer: 72 mL/min/{1.73_m2} (ref >59)
GFR calc non Af Amer: 62 mL/min/{1.73_m2} (ref >59)
Glucose: 113 mg/dL — ABNORMAL HIGH (ref 65–99)
POTASSIUM: 4.7 mmol/L (ref 3.5–5.2)
Sodium: 141 mmol/L (ref 134–144)

## 2017-12-26 LAB — HGB A1C W/O EAG: Hgb A1c MFr Bld: 6.2 % — ABNORMAL HIGH (ref 4.8–5.6)

## 2018-01-11 ENCOUNTER — Encounter: Payer: Self-pay | Admitting: Internal Medicine

## 2018-01-11 ENCOUNTER — Ambulatory Visit: Payer: Self-pay | Admitting: Internal Medicine

## 2018-01-11 VITALS — BP 138/98 | HR 72 | Resp 12 | Ht 63.0 in | Wt 166.0 lb

## 2018-01-11 DIAGNOSIS — R7303 Prediabetes: Secondary | ICD-10-CM

## 2018-01-11 DIAGNOSIS — R030 Elevated blood-pressure reading, without diagnosis of hypertension: Secondary | ICD-10-CM

## 2018-01-11 NOTE — Patient Instructions (Signed)

## 2018-01-11 NOTE — Progress Notes (Signed)
   Subjective:    Patient ID: Junius Creamerobert L Dacanay, male    DOB: 1961/06/17, 57 y.o.   MRN: [id number]  HPI   1.  Prediabetes:   Has gained 10 lbs since 04/2017  Up at 8 a.m. Eats 8:30 a.m. Grits, baked chicken, water, coffee with sugar and cream.    6 p.m.:  Chicken, salad, baked potato, lettuce tomatoes pepper, egg [place]   11-midnight:  Piece of chicken, bag of chips, cookie or rice crispy treat. May have sugar coated cereal or cookie.    Drinks a lot of [employer] and other sugary drinks.  Walks all day or digs up his yard for a garden.      Review of Systems     Objective:   Physical Exam NAD Radial pulses normal and equal       Assessment & Plan:  1.  Prediabetes:  Long discussion regarding lifestyle changes, particularly his diet and sugary drinks.  Also to work with this with his children and grandkids.  2.  Elevated BP:  A bit excited today as we were ribbing about the [employer].  IMproved but not at baseline before left.  Reexamine at next visit.  Return for A1C and BMP in 3 months with me 2 days later.

## 2018-01-27 ENCOUNTER — Telehealth: Payer: Self-pay | Admitting: Internal Medicine

## 2018-01-27 ENCOUNTER — Other Ambulatory Visit: Payer: Self-pay

## 2018-01-27 DIAGNOSIS — J302 Other seasonal allergic rhinitis: Secondary | ICD-10-CM

## 2018-01-27 MED ORDER — CETIRIZINE HCL 10 MG PO TABS: 10.0000 mg | ORAL_TABLET | Freq: Every day | ORAL | 11 refills | Status: DC

## 2018-02-02 NOTE — Telephone Encounter (Signed)
Error

## 2018-02-17 ENCOUNTER — Telehealth: Payer: Self-pay | Admitting: Internal Medicine

## 2018-02-17 NOTE — Telephone Encounter (Signed)
To Dr. Mulberry for further direction 

## 2018-02-17 NOTE — Telephone Encounter (Signed)
Patient called and needs letter regarding his inability to serve [employer].  Would like a letter from doctor to this regard.

## 2018-02-18 NOTE — Telephone Encounter (Signed)
Patient informed that we don not provide notes for jury duty.

## 2018-02-18 NOTE — Telephone Encounter (Signed)
We don't do notes for jury duty.

## 2018-02-23 NOTE — Telephone Encounter (Signed)
Error

## 2018-03-08 ENCOUNTER — Ambulatory Visit: Payer: Self-pay | Admitting: Internal Medicine

## 2018-03-15 ENCOUNTER — Telehealth: Payer: Self-pay | Admitting: Internal Medicine

## 2018-03-15 NOTE — Telephone Encounter (Signed)
Spoke with patient. Informed that we had sent Rx in December for #150 with 11 refills. Patient will contact the health department to get new Rx.

## 2018-03-15 NOTE — Telephone Encounter (Signed)
Patient would like large number of refills for Rx gabapentin (NEURONTIN) 100 mg capsule.  Patient stated he only gets 90 day supply and would like a larger supply.  Please advise patient at [phone]

## 2018-03-26 ENCOUNTER — Ambulatory Visit: Payer: Self-pay | Admitting: Internal Medicine

## 2018-03-26 ENCOUNTER — Encounter: Payer: Self-pay | Admitting: Internal Medicine

## 2018-03-26 VITALS — BP 122/82 | HR 60 | Resp 12 | Ht 63.0 in | Wt 160.0 lb

## 2018-03-26 DIAGNOSIS — M5442 Lumbago with sciatica, left side: Secondary | ICD-10-CM

## 2018-03-26 DIAGNOSIS — M542 Cervicalgia: Secondary | ICD-10-CM

## 2018-03-26 DIAGNOSIS — G8929 Other chronic pain: Secondary | ICD-10-CM

## 2018-03-26 NOTE — Progress Notes (Signed)
   Subjective:    Patient ID: Parker Harvey, male    DOB: Mar 29, 1961, 57 y.o.   MRN: [id number]  HPI Feels the Meloxicam was "cutting off his breath" really only noted at first when bending over.  Later, noted with long distance walking.  States he normally could walk 6 miles without any dyspnea.   Had similar problem with Naproxen, but had been on Meloxicam prior to that without problems.  Current Meds  Medication Sig  . cetirizine (ZYRTEC) 10 MG tablet Take 1 tablet (10 mg total) by mouth daily.  Marland Kitchen. gabapentin (NEURONTIN) 100 MG capsule 3 caps by mouth twice daily  . [DISCONTINUED] gabapentin (NEURONTIN) 100 MG capsule 2 caps by mouth in the am and 3 caps by mouth at bedtime    No Known Allergies   Review of Systems     Objective:   Physical Exam  NAD HEENT:  PERRL, EOMI Neck:  Tender to palpation over traps bilaterally--tight feeling to palpation.  Full ROM, but uncomfortable. Chest:  CTA CV:  RRR without murmur or rub, radial and DP pulses normal and equal. Wearing back support brace for low back        Assessment & Plan:  1.  Chronic low back pain and now with some upper back and neck discomfort.  Increase gabapentin to 3 caps 3 times daily--titrate every 3 days to full dose.  Refill Cyclobenzaprine to use mainly at bedtime Referral to [facility].

## 2018-03-26 NOTE — Patient Instructions (Signed)
Increase your gabapentin to 3 caps in the morning and 3 caps in the evening and stay on that until I see you back in May

## 2018-03-30 MED ORDER — GABAPENTIN 100 MG PO CAPS: ORAL_CAPSULE | ORAL | 11 refills | Status: DC

## 2018-03-30 MED ORDER — CYCLOBENZAPRINE HCL 5 MG PO TABS: ORAL_TABLET | ORAL | 2 refills | Status: DC

## 2018-03-31 ENCOUNTER — Other Ambulatory Visit: Payer: Self-pay

## 2018-03-31 MED ORDER — CYCLOBENZAPRINE HCL 5 MG PO TABS: ORAL_TABLET | ORAL | 2 refills | Status: DC

## 2018-04-09 NOTE — Progress Notes (Signed)
Referral, demographics and OV notes faxed to [facility]. Facility will contact patient and schedule appointment

## 2018-04-12 ENCOUNTER — Other Ambulatory Visit: Payer: Self-pay

## 2018-04-12 DIAGNOSIS — R7303 Prediabetes: Secondary | ICD-10-CM

## 2018-04-12 DIAGNOSIS — Z79899 Other long term (current) drug therapy: Secondary | ICD-10-CM

## 2018-04-12 DIAGNOSIS — Z13220 Encounter for screening for lipoid disorders: Secondary | ICD-10-CM

## 2018-04-13 LAB — CBC WITH DIFFERENTIAL/PLATELET
BASOS ABS: 0 10*3/uL (ref 0.0–0.2)
Basos: 0 %
EOS (ABSOLUTE): 0.1 10*3/uL (ref 0.0–0.4)
EOS: 2 %
HEMATOCRIT: 44.2 % (ref 37.5–51.0)
Hemoglobin: 14.2 g/dL (ref 13.0–17.7)
Immature Grans (Abs): 0 10*3/uL (ref 0.0–0.1)
Immature Granulocytes: 0 %
LYMPHS ABS: 2.9 10*3/uL (ref 0.7–3.1)
Lymphs: 44 %
MCH: 28.7 pg (ref 26.6–33.0)
MCHC: 32.1 g/dL (ref 31.5–35.7)
MCV: 90 fL (ref 79–97)
MONOCYTES: 9 %
MONOS ABS: 0.6 10*3/uL (ref 0.1–0.9)
Neutrophils Absolute: 3 10*3/uL (ref 1.4–7.0)
Neutrophils: 45 %
Platelets: 256 10*3/uL (ref 150–379)
RBC: 4.94 x10E6/uL (ref 4.14–5.80)
RDW: 14 % (ref 12.3–15.4)
WBC: 6.5 10*3/uL (ref 3.4–10.8)

## 2018-04-13 LAB — COMPREHENSIVE METABOLIC PANEL
ALK PHOS: 80 IU/L (ref 39–117)
ALT: 21 IU/L (ref 0–44)
AST: 24 IU/L (ref 0–40)
Albumin/Globulin Ratio: 1.4 (ref 1.2–2.2)
Albumin: 4.6 g/dL (ref 3.5–5.5)
BUN/Creatinine Ratio: 8 — ABNORMAL LOW (ref 9–20)
BUN: 12 mg/dL (ref 6–24)
Bilirubin Total: 0.6 mg/dL (ref 0.0–1.2)
CO2: 23 mmol/L (ref 20–29)
CREATININE: 1.45 mg/dL — AB (ref 0.76–1.27)
Calcium: 9.8 mg/dL (ref 8.7–10.2)
Chloride: 104 mmol/L (ref 96–106)
GFR calc Af Amer: 62 mL/min/{1.73_m2} (ref >59)
GFR calc non Af Amer: 54 mL/min/{1.73_m2} — ABNORMAL LOW (ref >59)
GLOBULIN, TOTAL: 3.3 g/dL (ref 1.5–4.5)
GLUCOSE: 103 mg/dL — AB (ref 65–99)
Potassium: 4.7 mmol/L (ref 3.5–5.2)
Sodium: 141 mmol/L (ref 134–144)
Total Protein: 7.9 g/dL (ref 6.0–8.5)

## 2018-04-13 LAB — LIPID PANEL W/O CHOL/HDL RATIO
Cholesterol, Total: 211 mg/dL — ABNORMAL HIGH (ref 100–199)
HDL: 51 mg/dL (ref >39)
LDL CALC: 132 mg/dL — AB (ref 0–99)
Triglycerides: 141 mg/dL (ref 0–149)
VLDL Cholesterol Cal: 28 mg/dL (ref 5–40)

## 2018-04-13 LAB — HGB A1C W/O EAG: Hgb A1c MFr Bld: 6.5 % — ABNORMAL HIGH (ref 4.8–5.6)

## 2018-04-14 ENCOUNTER — Ambulatory Visit: Payer: Self-pay | Admitting: Internal Medicine

## 2018-04-22 ENCOUNTER — Encounter: Payer: Self-pay | Admitting: Internal Medicine

## 2018-05-14 ENCOUNTER — Ambulatory Visit: Payer: Self-pay | Admitting: Internal Medicine

## 2018-05-14 ENCOUNTER — Encounter: Payer: Self-pay | Admitting: Internal Medicine

## 2018-05-14 VITALS — BP 128/84 | HR 66 | Resp 12 | Ht 63.0 in | Wt 161.0 lb

## 2018-05-14 DIAGNOSIS — M5442 Lumbago with sciatica, left side: Secondary | ICD-10-CM

## 2018-05-14 DIAGNOSIS — G8929 Other chronic pain: Secondary | ICD-10-CM

## 2018-05-14 DIAGNOSIS — E782 Mixed hyperlipidemia: Secondary | ICD-10-CM

## 2018-05-14 DIAGNOSIS — E119 Type 2 diabetes mellitus without complications: Secondary | ICD-10-CM

## 2018-05-14 MED ORDER — GABAPENTIN 300 MG PO CAPS: ORAL_CAPSULE | ORAL | 11 refills | Status: DC

## 2018-05-14 MED ORDER — ATORVASTATIN CALCIUM 40 MG PO TABS: ORAL_TABLET | ORAL | 11 refills | Status: DC

## 2018-05-14 MED ORDER — METFORMIN HCL ER 500 MG PO TB24: ORAL_TABLET | ORAL | 11 refills | Status: DC

## 2018-05-14 NOTE — Patient Instructions (Signed)

## 2018-05-14 NOTE — Progress Notes (Signed)
   Subjective:    Patient ID: Parker Harvey, male    DOB: Jun 07, 1961, 57 y.o.   MRN: [id number]  HPI  1.  New onset DM:  A1C now up to 6.5%.  He describes a diet that still is high in fried fatty foods and carbs.   Long discussion of lifestyle changes again.   Discussed complications of DM  2.  Hyperlipidemia:  Unable to take ASA due to NSAID allergy.  Discussed starting atorvastitin.  3.  Chronic back pain : doing well with gabapentin twice daily.  He is not interested in adding a midday dose.   Current Meds  Medication Sig  . cetirizine (ZYRTEC) 10 MG tablet Take 1 tablet (10 mg total) by mouth daily.  . cyclobenzaprine (FLEXERIL) 5 MG tablet 1-2 tabs every 8 hours as needed for muscle/back pain  . gabapentin (NEURONTIN) 300 MG capsule 1 cap by mouth twice daily  . [DISCONTINUED] gabapentin (NEURONTIN) 100 MG capsule 3 caps by mouth twice daily    Allergies  Allergen Reactions  . Meloxicam Shortness Of Breath    Not clear if true allergy--started out as shortness of breath only when bending over   . Naproxen Shortness Of Breath   Review of Systems     Objective:   Physical Exam NAD Lungs:  CTA CV:  RRR without murmur or rub.  Radial and DP pulses normal and equal. Abd:  S, NT, No HSM or mass, + BS       Assessment & Plan:  1.  New onset DM:  Continue to work on lifestyle changes with diet and physical activity, the latter limited by his back issues. Metformin ER 500 mg daily. Check urine microalbumin/crea A1C in 3 months with follow up with me 2 days later.  2.  Hyperlipidemia:  Start Atorvastatin 40 mg daily.  Repeat FLP and CMP in 6 weeks.    3.  Chronic back pain:  Seems to finally be getting some relief with Gabapentin.  Switch back to 300 mg twice daily to decrease number of pills and as he feels current dose is all he needs.

## 2018-05-15 LAB — MICROALBUMIN / CREATININE URINE RATIO
CREATININE, UR: 59.5 mg/dL
Microalb/Creat Ratio: <5 mg/g creat (ref 0.0–30.0)
Microalbumin, Urine: <3 ug/mL

## 2018-05-17 ENCOUNTER — Other Ambulatory Visit: Payer: Self-pay

## 2018-05-17 MED ORDER — ATORVASTATIN CALCIUM 40 MG PO TABS: ORAL_TABLET | ORAL | 11 refills | Status: DC

## 2018-05-17 MED ORDER — METFORMIN HCL ER 500 MG PO TB24: ORAL_TABLET | ORAL | 11 refills | Status: DC

## 2018-05-25 ENCOUNTER — Telehealth: Payer: Self-pay | Admitting: Internal Medicine

## 2018-05-25 NOTE — Telephone Encounter (Signed)
Patient has updated [employer] and would [facility]  [facility] Rx are too costly.

## 2018-05-26 ENCOUNTER — Other Ambulatory Visit: Payer: Self-pay

## 2018-05-26 MED ORDER — ATORVASTATIN CALCIUM 40 MG PO TABS: ORAL_TABLET | ORAL | 11 refills | Status: DC

## 2018-05-26 NOTE — Telephone Encounter (Signed)
Only one Rx needed to be sent to [facility]. [facility]

## 2018-06-18 DIAGNOSIS — Z0279 Encounter for issue of other medical certificate: Secondary | ICD-10-CM

## 2018-06-25 ENCOUNTER — Other Ambulatory Visit: Payer: Self-pay

## 2018-06-25 DIAGNOSIS — Z79899 Other long term (current) drug therapy: Secondary | ICD-10-CM

## 2018-06-25 DIAGNOSIS — E782 Mixed hyperlipidemia: Secondary | ICD-10-CM

## 2018-06-26 LAB — COMPREHENSIVE METABOLIC PANEL
A/G RATIO: 1.6 (ref 1.2–2.2)
ALBUMIN: 4.8 g/dL (ref 3.5–5.5)
ALT: 81 IU/L — ABNORMAL HIGH (ref 0–44)
AST: 77 IU/L — ABNORMAL HIGH (ref 0–40)
Alkaline Phosphatase: 83 IU/L (ref 39–117)
BILIRUBIN TOTAL: 0.3 mg/dL (ref 0.0–1.2)
BUN / CREAT RATIO: 7 — AB (ref 9–20)
BUN: 9 mg/dL (ref 6–24)
CHLORIDE: 106 mmol/L (ref 96–106)
CO2: 18 mmol/L — ABNORMAL LOW (ref 20–29)
Calcium: 9.8 mg/dL (ref 8.7–10.2)
Creatinine, Ser: 1.31 mg/dL — ABNORMAL HIGH (ref 0.76–1.27)
GFR, EST AFRICAN AMERICAN: 70 mL/min/{1.73_m2} (ref >59)
GFR, EST NON AFRICAN AMERICAN: 61 mL/min/{1.73_m2} (ref >59)
GLOBULIN, TOTAL: 3 g/dL (ref 1.5–4.5)
Glucose: 95 mg/dL (ref 65–99)
POTASSIUM: 4.5 mmol/L (ref 3.5–5.2)
SODIUM: 143 mmol/L (ref 134–144)
TOTAL PROTEIN: 7.8 g/dL (ref 6.0–8.5)

## 2018-06-26 LAB — LIPID PANEL W/O CHOL/HDL RATIO
CHOLESTEROL TOTAL: 131 mg/dL (ref 100–199)
HDL: 52 mg/dL (ref >39)
LDL Calculated: 58 mg/dL (ref 0–99)
TRIGLYCERIDES: 107 mg/dL (ref 0–149)
VLDL Cholesterol Cal: 21 mg/dL (ref 5–40)

## 2018-07-15 ENCOUNTER — Other Ambulatory Visit: Payer: Self-pay

## 2018-07-15 DIAGNOSIS — R748 Abnormal levels of other serum enzymes: Secondary | ICD-10-CM

## 2018-07-15 DIAGNOSIS — E119 Type 2 diabetes mellitus without complications: Secondary | ICD-10-CM

## 2018-07-16 LAB — HEPATIC FUNCTION PANEL
ALBUMIN: 4.5 g/dL (ref 3.5–5.5)
ALT: 34 IU/L (ref 0–44)
AST: 32 IU/L (ref 0–40)
Alkaline Phosphatase: 78 IU/L (ref 39–117)
BILIRUBIN, DIRECT: 0.13 mg/dL (ref 0.00–0.40)
Bilirubin Total: 0.5 mg/dL (ref 0.0–1.2)
TOTAL PROTEIN: 7.4 g/dL (ref 6.0–8.5)

## 2018-07-16 LAB — HGB A1C W/O EAG: HEMOGLOBIN A1C: 6.3 % — AB (ref 4.8–5.6)

## 2018-08-13 ENCOUNTER — Other Ambulatory Visit: Payer: Self-pay

## 2018-08-18 ENCOUNTER — Encounter: Payer: Self-pay | Admitting: Internal Medicine

## 2018-08-18 ENCOUNTER — Ambulatory Visit (INDEPENDENT_AMBULATORY_CARE_PROVIDER_SITE_OTHER): Payer: Self-pay | Admitting: Internal Medicine

## 2018-08-18 VITALS — BP 118/82 | HR 76 | Resp 12 | Ht 63.0 in | Wt 153.0 lb

## 2018-08-18 DIAGNOSIS — G8929 Other chronic pain: Secondary | ICD-10-CM

## 2018-08-18 DIAGNOSIS — F518 Other sleep disorders not due to a substance or known physiological condition: Secondary | ICD-10-CM

## 2018-08-18 DIAGNOSIS — M545 Low back pain: Secondary | ICD-10-CM

## 2018-08-18 DIAGNOSIS — B356 Tinea cruris: Secondary | ICD-10-CM

## 2018-08-18 DIAGNOSIS — B351 Tinea unguium: Secondary | ICD-10-CM

## 2018-08-18 DIAGNOSIS — E119 Type 2 diabetes mellitus without complications: Secondary | ICD-10-CM

## 2018-08-18 DIAGNOSIS — E782 Mixed hyperlipidemia: Secondary | ICD-10-CM

## 2018-08-18 LAB — GLUCOSE, POCT (MANUAL RESULT ENTRY): POC Glucose: 126 mg/dl — AB (ref 70–99)

## 2018-08-18 MED ORDER — AGAMATRIX ULTRA-THIN LANCETS MISC: 11 refills | Status: DC

## 2018-08-18 MED ORDER — TERBINAFINE HCL 250 MG PO TABS: ORAL_TABLET | ORAL | 0 refills | Status: DC

## 2018-08-18 MED ORDER — GLUCOSE BLOOD VI STRP: ORAL_STRIP | 12 refills | Status: DC

## 2018-08-18 MED ORDER — AGAMATRIX PRESTO W/DEVICE KIT: PACK | 0 refills | Status: DC

## 2018-08-18 NOTE — Patient Instructions (Signed)
For foot and toenail fungus:  Spray your shoes with Lysol or Lotrimin Antifungal spray when you start the medication for your feet.   Re-spray your the shoes you wear with each wearing and allow to dry before wearing again You will need to continue to spray the shoes you have during the infection of your toenails and feet have been thrown out due to wear.  Once your toenails and feet are clear of infection, the shoes you buy new do not necessarily need to be sprayed. Clean your shower floor once to twice daily with bleach containing cleaner. 

## 2018-08-18 NOTE — Progress Notes (Signed)
Subjective:    Patient ID: Parker Harvey, male    DOB: 1961/12/12, 57 y.o.   MRN: [id number]  HPI  1.  Elevated hepatic transaminases:  Resolved with recent testing.  Patient did not drink much alcohol, but we asked him to stop in between testing.  Not clear this was the etiology of elevated enzymes.  Discussed.    2. DM:  A1C at beginning of August was down to 6.3%.    He does not check sugars at home.    3.  Hemorrhoids and itch inside bilateral thighs:  Wants Rx for Lidocaine ointment, but discussed very expensive, even with [facility].  He is willing to try corticosteroid cream.  No symptoms now.  4.  Hyperlipidemia:  At goal with Atorvastatin,  5.  Fatigue--only early afternoon for a couple of hours:   Started about 1 month ago.  No new meds since June.  When up moving around, he is fine, but as soon as sits down, falls asleep.  Does not drive, but does not fall asleep easily as a passenger. Sleeps well.  He states he does not snore.  Feels well rested when gets up.   Feels tired around 2:30 p.m. While watching daytime stories.  Feels like he is having vivid dreams- it's like he is seeing something that 's not there, but when he wakes, realizes he has been dreaming.   Denies any over the counter medication.  Current Meds  Medication Sig  . atorvastatin (LIPITOR) 40 MG tablet 1 tab by mouth daily with evening meal  . cetirizine (ZYRTEC) 10 MG tablet Take 1 tablet (10 mg total) by mouth daily.  . cyclobenzaprine (FLEXERIL) 5 MG tablet 1-2 tabs every 8 hours as needed for muscle/back pain  . gabapentin (NEURONTIN) 300 MG capsule 1 cap by mouth twice daily  . metFORMIN (GLUCOPHAGE-XR) 500 MG 24 hr tablet 1 tab by mouth daily with breakfast    Allergies  Allergen Reactions  . Meloxicam Shortness Of Breath    Not clear if true allergy--started out as shortness of breath only when bending over   . Naproxen Shortness Of Breath   Review of Systems     Objective:   Physical Exam    NAD Lungs:  CTA CV:  RRR without murmur or rub.  Radial and DP pulses normal and equal Abd:  S, NT, No HSM or mass, + BS Skin:  Hyperpigmented flaky areas with definite undulating border from genital area, involving inguinal folds and medial aspect of bilateral thighs.        Assessment & Plan:  1.  Elevated transaminases:  Normalized.  To limit alcohol intake, though not clear this was the etiology.  2.  Hyperlipidemia:  Tolerating Atorvastatin and liver enzymes now back to normal with continued use, so unlikely the cause as well.  3.  DM:  Improved control with Metformin, also with continued use with normalization of liver enzymes.  Rx for glucometer and associated supplies to check sugars as needed if not feeling well. Influenza vaccine clinics discussed in September and October. Did not get his optometry flier to him--will send out.  4.  Toenail onychomycosis and probable tinea cruris, though could be erythrasma and consider the latter if does not respond to antifungals. Terbinafine 250 mg daily for 84 days.  Discussed footwear care and shower floor cleaning.  Follow up in 6 and 12 weeks.  5.  Naps with vivid dreams:  He will let us know if this  becomes more of a problem.  No concern for now.

## 2018-09-01 ENCOUNTER — Telehealth: Payer: Self-pay

## 2018-09-01 NOTE — Telephone Encounter (Signed)
Patient called in stating he needs to stop his medication because he is seeing things that are not really there. States the Metformin and Atorvastatin are the only new things that he has been taking.   Spoke with Dr. Delrae AlfredMulberry regarding patient issue. Per Dr. Delrae AlfredMulberry needed more information as to what he is seeing and how often.  called patient to get more information. Patient states he sees things that are not really there while he is sleeping. States he may doze off while watching TV and have really weird and vivid dreams and states when he wakes up nothing is happening. States this is only when he is dreaming and not when he is awake. Patient states he thinks these dreams are only happening because he started these 2 medications. States he has been having these weird dreams x 1 1/2-2 months.  To Dr. Delrae AlfredMulberry to advise.  Per Dr. Delrae AlfredMulberry she would like patient to continue medication if possible. Spoke with patient and he verbally agreed to continue medication. States he will go ahead and refill medication

## 2018-09-22 ENCOUNTER — Encounter: Payer: Self-pay | Admitting: Internal Medicine

## 2018-09-22 ENCOUNTER — Ambulatory Visit: Payer: Self-pay | Admitting: Internal Medicine

## 2018-09-22 VITALS — BP 110/80 | HR 74 | Resp 12 | Ht 63.0 in | Wt 152.0 lb

## 2018-09-22 DIAGNOSIS — K0889 Other specified disorders of teeth and supporting structures: Secondary | ICD-10-CM

## 2018-09-22 MED ORDER — HYDROCODONE-ACETAMINOPHEN 5-325 MG PO TABS: ORAL_TABLET | ORAL | 0 refills | Status: DC

## 2018-09-22 MED ORDER — PENICILLIN V POTASSIUM 250 MG PO TABS: ORAL_TABLET | ORAL | 0 refills | Status: DC

## 2018-09-22 NOTE — Progress Notes (Signed)
   Subjective:    Patient ID: Parker Harvey, male    DOB: June 04, 1961, 57 y.o.   MRN: [id number]  HPI   Loose painful right posterior molar.  States he was eating an apple 4 days ago and heard a crack.  Now quite loose and painful if manipulated.   No fever.   Is having some gingival swelling about the tooth.  Current Meds  Medication Sig  . atorvastatin (LIPITOR) 40 MG tablet 1 tab by mouth daily with evening meal  . cetirizine (ZYRTEC) 10 MG tablet Take 1 tablet (10 mg total) by mouth daily.  . cyclobenzaprine (FLEXERIL) 5 MG tablet 1-2 tabs every 8 hours as needed for muscle/back pain  . gabapentin (NEURONTIN) 300 MG capsule 1 cap by mouth twice daily  . metFORMIN (GLUCOPHAGE-XR) 500 MG 24 hr tablet 1 tab by mouth daily with breakfast  . terbinafine (LAMISIL) 250 MG tablet 1 tab by mouth once daily for 84 days.    Allergies  Allergen Reactions  . Meloxicam Shortness Of Breath    Not clear if true allergy--started out as shortness of breath only when bending over   . Naproxen Shortness Of Breath    Review of Systems     Objective:   Physical Exam Right posterior mandibular molar with very large central filling just hanging on by lingual front corner.  Came off easily with underlying root remnants imbedded in gum.  Some surrounding gingival swelling and erythema without fluctuance.   Generalized dental decay and tooth loss. Neck:  Supple, No adenopathy Lungs:  CTA CV: RRR without murmur or rub.  Radial pulses normal and equal       Assessment & Plan:  Broken decayed tooth with removal of the majority.  Decayed root still imbedded in gum. Appears to have some surrounding infection. Penicillin 250 mg 4 times daily for 7 days. As allergic to NSAIDS, will Rx minimal amount of Hydrocodone/Tylenol. Dental referral.

## 2018-09-29 ENCOUNTER — Encounter: Payer: Self-pay | Admitting: Internal Medicine

## 2018-09-29 ENCOUNTER — Ambulatory Visit: Payer: Self-pay | Admitting: Internal Medicine

## 2018-09-29 VITALS — BP 130/70 | HR 78 | Resp 12 | Ht 63.0 in | Wt 151.0 lb

## 2018-09-29 DIAGNOSIS — B351 Tinea unguium: Secondary | ICD-10-CM

## 2018-09-29 DIAGNOSIS — Z79899 Other long term (current) drug therapy: Secondary | ICD-10-CM

## 2018-09-29 DIAGNOSIS — B356 Tinea cruris: Secondary | ICD-10-CM

## 2018-09-29 NOTE — Progress Notes (Signed)
   Subjective:    Patient ID: Parker Harvey, male    DOB: 30-Aug-1961, 57 y.o.   MRN: [id number]  HPI  Here for followup up onychomycosis/tinea cruris/tinea pedis:    Tinea cruris involvement resolved Toenails growing out normally and clear Feet without flaking Treating shoes and shower floor regularly  Current Meds  Medication Sig  . atorvastatin (LIPITOR) 40 MG tablet 1 tab by mouth daily with evening meal  . cetirizine (ZYRTEC) 10 MG tablet Take 1 tablet (10 mg total) by mouth daily.  . cyclobenzaprine (FLEXERIL) 5 MG tablet 1-2 tabs every 8 hours as needed for muscle/back pain  . gabapentin (NEURONTIN) 300 MG capsule 1 cap by mouth twice daily  . HYDROcodone-acetaminophen (NORCO/VICODIN) 5-325 MG tablet 1 tab by mouth every 6 hours as needed for severe pain  . metFORMIN (GLUCOPHAGE-XR) 500 MG 24 hr tablet 1 tab by mouth daily with breakfast  . penicillin v potassium (VEETID) 250 MG tablet 1 tab by mouth 4 times daily for 7 days.  Marland Kitchen terbinafine (LAMISIL) 250 MG tablet 1 tab by mouth once daily for 84 days.   Allergies  Allergen Reactions  . Meloxicam Shortness Of Breath    Not clear if true allergy--started out as shortness of breath only when bending over   . Naproxen Shortness Of Breath    Review of Systems     Objective:   Physical Exam   Groin areas clear.  Toenails with 4 mm of clearing at base.  Feet without flaking.     Assessment & Plan:  Toenail onychomycosis:  Finish Terbinafine for 12 week course and continue treatment of shoes and shower floor. Hep profile.

## 2018-09-30 LAB — HEPATIC FUNCTION PANEL
ALBUMIN: 4.6 g/dL (ref 3.5–5.5)
ALK PHOS: 80 IU/L (ref 39–117)
ALT: 35 IU/L (ref 0–44)
AST: 30 IU/L (ref 0–40)
BILIRUBIN, DIRECT: 0.1 mg/dL (ref 0.00–0.40)
Bilirubin Total: 0.3 mg/dL (ref 0.0–1.2)
TOTAL PROTEIN: 7.7 g/dL (ref 6.0–8.5)

## 2018-11-04 ENCOUNTER — Telehealth: Payer: Self-pay

## 2018-11-04 NOTE — Telephone Encounter (Signed)
Patient called asking for refill on hydrocodone. Patient states his tooth pain is still bothering him. States he has not been able to get tooth pulled just yet.  Per Dr. Delrae AlfredMulberry patient will need to be seen first.

## 2018-11-04 NOTE — Telephone Encounter (Signed)
Patient informed needs to be seen before any refill. Patient coming in on monday

## 2018-11-08 ENCOUNTER — Other Ambulatory Visit: Payer: Self-pay

## 2018-11-08 DIAGNOSIS — R7303 Prediabetes: Secondary | ICD-10-CM

## 2018-11-08 DIAGNOSIS — Z79899 Other long term (current) drug therapy: Secondary | ICD-10-CM

## 2018-11-09 LAB — COMPREHENSIVE METABOLIC PANEL
A/G RATIO: 1.6 (ref 1.2–2.2)
ALT: 27 IU/L (ref 0–44)
AST: 21 IU/L (ref 0–40)
Albumin: 4.4 g/dL (ref 3.5–5.5)
Alkaline Phosphatase: 70 IU/L (ref 39–117)
BUN/Creatinine Ratio: 9 (ref 9–20)
BUN: 11 mg/dL (ref 6–24)
Bilirubin Total: 0.3 mg/dL (ref 0.0–1.2)
CHLORIDE: 105 mmol/L (ref 96–106)
CO2: 23 mmol/L (ref 20–29)
Calcium: 9.8 mg/dL (ref 8.7–10.2)
Creatinine, Ser: 1.19 mg/dL (ref 0.76–1.27)
GFR calc non Af Amer: 68 mL/min/{1.73_m2} (ref >59)
GFR, EST AFRICAN AMERICAN: 79 mL/min/{1.73_m2} (ref >59)
GLOBULIN, TOTAL: 2.8 g/dL (ref 1.5–4.5)
Glucose: 100 mg/dL — ABNORMAL HIGH (ref 65–99)
POTASSIUM: 4.5 mmol/L (ref 3.5–5.2)
SODIUM: 143 mmol/L (ref 134–144)
TOTAL PROTEIN: 7.2 g/dL (ref 6.0–8.5)

## 2018-11-09 LAB — HGB A1C W/O EAG: Hgb A1c MFr Bld: 6.3 % — ABNORMAL HIGH (ref 4.8–5.6)

## 2018-11-10 ENCOUNTER — Encounter: Payer: Self-pay | Admitting: Internal Medicine

## 2018-11-10 ENCOUNTER — Ambulatory Visit: Payer: Self-pay | Admitting: Internal Medicine

## 2018-11-10 VITALS — BP 120/72 | HR 88 | Resp 16 | Ht 63.0 in | Wt 155.5 lb

## 2018-11-10 DIAGNOSIS — B351 Tinea unguium: Secondary | ICD-10-CM

## 2018-11-10 DIAGNOSIS — E119 Type 2 diabetes mellitus without complications: Secondary | ICD-10-CM

## 2018-11-10 NOTE — Progress Notes (Signed)
   Subjective:    Patient ID: Parker Harvey, male    DOB: [birth year], 57 y.o.   MRN: [id number]  HPI  Here for follow up of toenails and labs:  1.  Toenails growing out with clearing--minimal at base.  His feet are without flaking and erythema now as well.   Has just under a week left of Terbinafine to complete course. Liver enzymes normal 2 days ago.  2.  DM:  A1C stable at 6.3%.  He does check sugars.  Current Meds  Medication Sig  . atorvastatin (LIPITOR) 40 MG tablet 1 tab by mouth daily with evening meal  . cetirizine (ZYRTEC) 10 MG tablet Take 1 tablet (10 mg total) by mouth daily.  . cyclobenzaprine (FLEXERIL) 5 MG tablet 1-2 tabs every 8 hours as needed for muscle/back pain  . gabapentin (NEURONTIN) 300 MG capsule 1 cap by mouth twice daily  . metFORMIN (GLUCOPHAGE-XR) 500 MG 24 hr tablet 1 tab by mouth daily with breakfast  . terbinafine (LAMISIL) 250 MG tablet 1 tab by mouth once daily for 84 days.    Allergies  Allergen Reactions  . Meloxicam Shortness Of Breath    Not clear if true allergy--started out as shortness of breath only when bending over   . Naproxen Shortness Of Breath     Review of Systems     Objective:   Physical Exam NAD Lungs:  CTA CV: RRR without murmur or rub.  Radial pulses normal and equal Feet:  Toenails quite dark and thickened with crumbling at distal 3/4 aspect. Appears to have small sliver of clearing at bases. Skin clear on feet.       Assessment & Plan:  1.  Toenail onychomycosis, tinea pedis and tinea cruris:  Latter 2 resolved.  Toenail involvement appears to be responding.   To continue to treat inside shoes and shower floor. Will have to see if continues to grow out normally  2.  DM:  Stable with good control at A1C of 6.3%

## 2018-11-25 ENCOUNTER — Telehealth: Payer: Self-pay

## 2018-11-25 MED ORDER — PENICILLIN V POTASSIUM 500 MG PO TABS: 500.0000 mg | ORAL_TABLET | Freq: Four times a day (QID) | ORAL | 0 refills | Status: AC

## 2018-11-25 NOTE — Telephone Encounter (Signed)
Patient called stating he can not get into the dental clinic until the end of January. Patient states the dentist office told him to call our office for antibiotics.   Per Dr. Delrae AlfredMulberry patient can have penicillin 500 mg. 1 4 times a day for 7 days. Patient aware and Rx called into [facility].

## 2018-12-16 ENCOUNTER — Telehealth: Payer: Self-pay | Admitting: Internal Medicine

## 2018-12-16 NOTE — Telephone Encounter (Signed)
Patient would like to change the way he takes his gabapentin (NEURONTIN) 300 MG capsule.  He is taking 3 in am and 3 in pm.  Would like to change and take only 1 in am and 1 in pm.  Please advise.

## 2018-12-17 ENCOUNTER — Other Ambulatory Visit: Payer: Self-pay

## 2018-12-17 MED ORDER — GABAPENTIN 300 MG PO CAPS: ORAL_CAPSULE | ORAL | 11 refills | Status: DC

## 2018-12-29 ENCOUNTER — Other Ambulatory Visit: Payer: Self-pay | Admitting: Internal Medicine

## 2019-01-03 ENCOUNTER — Encounter: Payer: Self-pay | Admitting: Internal Medicine

## 2019-01-03 DIAGNOSIS — E119 Type 2 diabetes mellitus without complications: Secondary | ICD-10-CM | POA: Insufficient documentation

## 2019-01-24 ENCOUNTER — Other Ambulatory Visit: Payer: Self-pay

## 2019-01-24 MED ORDER — CYCLOBENZAPRINE HCL 5 MG PO TABS: ORAL_TABLET | ORAL | 2 refills | Status: DC

## 2019-02-07 ENCOUNTER — Other Ambulatory Visit: Payer: Self-pay

## 2019-02-07 DIAGNOSIS — E782 Mixed hyperlipidemia: Secondary | ICD-10-CM

## 2019-02-07 DIAGNOSIS — Z7251 High risk heterosexual behavior: Secondary | ICD-10-CM

## 2019-02-08 LAB — LIPID PANEL W/O CHOL/HDL RATIO
CHOLESTEROL TOTAL: 119 mg/dL (ref 100–199)
HDL: 45 mg/dL (ref >39)
LDL Calculated: 59 mg/dL (ref 0–99)
TRIGLYCERIDES: 75 mg/dL (ref 0–149)
VLDL CHOLESTEROL CAL: 15 mg/dL (ref 5–40)

## 2019-02-08 LAB — HIV ANTIBODY (ROUTINE TESTING W REFLEX): HIV SCREEN 4TH GENERATION: NONREACTIVE

## 2019-02-09 ENCOUNTER — Telehealth: Payer: Self-pay | Admitting: Internal Medicine

## 2019-02-09 ENCOUNTER — Encounter: Payer: Self-pay | Admitting: Internal Medicine

## 2019-02-09 ENCOUNTER — Ambulatory Visit: Payer: Self-pay | Admitting: Internal Medicine

## 2019-02-09 VITALS — BP 122/80 | HR 74 | Resp 12 | Ht 63.0 in | Wt 153.0 lb

## 2019-02-09 DIAGNOSIS — B351 Tinea unguium: Secondary | ICD-10-CM

## 2019-02-09 DIAGNOSIS — E119 Type 2 diabetes mellitus without complications: Secondary | ICD-10-CM

## 2019-02-09 DIAGNOSIS — E782 Mixed hyperlipidemia: Secondary | ICD-10-CM

## 2019-02-09 DIAGNOSIS — L600 Ingrowing nail: Secondary | ICD-10-CM

## 2019-02-09 NOTE — Patient Instructions (Addendum)
For immunizations at [facility]:  Children and Adults: [place] and [employer]: [id number] (Information in Spanish available)  You need Pneumococcal 23 valent due to diabetes and Td.  You had a Tdap in 2010  Soak you foot in warm water with epsom salts for 20 minutes twice daily and gently push skin away from nail just afterward.

## 2019-02-09 NOTE — Progress Notes (Signed)
    Subjective:    Patient ID: Parker Harvey, male   DOB: [birth year], 58 y.o.   MRN: [id number]   HPI  1.  Hypercholesterolemia:  Recent labs at goal with Atorvastatin 40 mg daily.  Hepatic panel in November fine as well.      Lipid Panel     Component Value Date/Time   CHOL 119 02/07/2019 0939   TRIG 75 02/07/2019 0939   HDL 45 02/07/2019 0939   CHOLHDL 2.6 08/18/2013 1008   VLDL 71 (H) 08/18/2013 1008   LDLCALC 59 02/07/2019 0939    2.  DM:  Well controlled when last checked in November.  A1C 6.3%.    Current Meds  Medication Sig  . atorvastatin (LIPITOR) 40 MG tablet 1 tab by mouth daily with evening meal  . cetirizine (ZYRTEC) 10 MG tablet Take 1 tablet (10 mg total) by mouth daily.  . cyclobenzaprine (FLEXERIL) 5 MG tablet 1-2 tabs every 8 hours as needed for muscle/back pain  . gabapentin (NEURONTIN) 300 MG capsule 1 cap by mouth twice daily  . metFORMIN (GLUCOPHAGE-XR) 500 MG 24 hr tablet 1 tab by mouth daily with breakfast   Allergies  Allergen Reactions  . Meloxicam Shortness Of Breath    Not clear if true allergy--started out as shortness of breath only when bending over   . Naproxen Shortness Of Breath     Review of Systems    Objective:   BP 122/80 (BP Location: Left Arm, Patient Position: Sitting, Cuff Size: Normal)   Pulse 74   Resp 12   Ht 5\' 3"  (1.6 m)   Wt 153 lb (69.4 kg)   BMI 27.10 kg/m   Physical Exam   Toenails with just a sliver of thickening and discoloration at the distal aspect (bilateral great toes.)  Mild tenderness at medial distal nail aspect of right great toe.  No erythema or swelling.  No pustular discharge.     Assessment & Plan  1.  Hypercholesterolemia:  At goal with Atorvastatin.  CPM  2.  DM:  Has been controlled. Needs Pneumococcal vaccine 23 v and eye exam Needs eye referral  3.  HM:  Needs Td.  To obtain at [facility].  4.  Mild ingrown toenail of right great toe.  No cutting into side of nail.  Discussed soaking  and gently moving skin off nail bed.  To call if worsens.

## 2019-02-09 NOTE — Telephone Encounter (Signed)
To Dr. Mulberry for further directions 

## 2019-02-09 NOTE — Telephone Encounter (Signed)
Patient called to inform Dr. Delrae Alfred Pneumococcal vaccine 23 at [facility] has a cost of $ 62.00 dollars; patient cannot afford it now.  Patient requesting advise from Dr. Delrae Alfred on what to do now.  Please advise.

## 2019-02-10 NOTE — Telephone Encounter (Signed)
We will try to pull together some funds to get some and cost would be $25 Parker Harvey:  Go ahead and order a pack of 10, just fill out the paperwork and let me sign--need to see how much first

## 2019-03-02 ENCOUNTER — Other Ambulatory Visit: Payer: Self-pay | Admitting: Internal Medicine

## 2019-03-02 DIAGNOSIS — J302 Other seasonal allergic rhinitis: Secondary | ICD-10-CM

## 2019-03-03 NOTE — Telephone Encounter (Signed)
Patient is aware 

## 2019-03-08 ENCOUNTER — Other Ambulatory Visit: Payer: Self-pay | Admitting: Internal Medicine

## 2019-03-08 DIAGNOSIS — J302 Other seasonal allergic rhinitis: Secondary | ICD-10-CM

## 2019-03-08 MED ORDER — CETIRIZINE HCL 10 MG PO TABS: 10.0000 mg | ORAL_TABLET | Freq: Every day | ORAL | 11 refills | Status: DC

## 2019-04-04 ENCOUNTER — Telehealth: Payer: Self-pay

## 2019-04-04 NOTE — Telephone Encounter (Signed)
Patient called stating he saw the dentist last month for his tooth and they numbed his mouth. Patient states ever since then he has been having numbness and soreness under his tongue. Patient states he spoke with the dentist office and they informed him they needed him to call our office since they are closed for now. patient not sure if he needs an appointment or is there something he can do OTC. States he gets a tingling feeling that goes through his tongue.

## 2019-04-05 NOTE — Telephone Encounter (Signed)
Spoke with Dr. Delrae Alfred regarding this and she is willing to see him for this. Spoke with patient and he is scheduled for 04/11/2019 @ 9:30 am. Patient verbalized understanding.

## 2019-04-11 ENCOUNTER — Ambulatory Visit: Payer: Self-pay | Admitting: Internal Medicine

## 2019-05-20 ENCOUNTER — Other Ambulatory Visit: Payer: Self-pay | Admitting: Internal Medicine

## 2019-06-10 ENCOUNTER — Ambulatory Visit: Payer: Self-pay | Admitting: Internal Medicine

## 2019-06-10 ENCOUNTER — Other Ambulatory Visit: Payer: Self-pay

## 2019-06-10 ENCOUNTER — Encounter: Payer: Self-pay | Admitting: Internal Medicine

## 2019-06-10 VITALS — BP 122/82 | HR 78 | Resp 12 | Ht 63.0 in | Wt 155.0 lb

## 2019-06-10 DIAGNOSIS — Z23 Encounter for immunization: Secondary | ICD-10-CM

## 2019-06-10 DIAGNOSIS — Z0000 Encounter for general adult medical examination without abnormal findings: Secondary | ICD-10-CM

## 2019-06-10 DIAGNOSIS — Z125 Encounter for screening for malignant neoplasm of prostate: Secondary | ICD-10-CM

## 2019-06-10 DIAGNOSIS — E782 Mixed hyperlipidemia: Secondary | ICD-10-CM

## 2019-06-10 DIAGNOSIS — E119 Type 2 diabetes mellitus without complications: Secondary | ICD-10-CM

## 2019-06-10 NOTE — Patient Instructions (Signed)
Call in when you are ready to have me remove your ingrown toenail

## 2019-06-10 NOTE — Progress Notes (Signed)
Subjective:    Patient ID: Parker Harvey, male   DOB: 09-12-1961, 58 y.o.   MRN: [id number]   HPI   Here for Male CPE:  1.  STE:  Performs daily with shower.  No findings.  No family history of testicular cancer.   2.  PSA/DRE:  PSA 1.1 in 2017.  DRE yearly and normal.  No family history of prostate cancer.    3.  Guaiac Cards:  Last performed 09/2017 and negative for blood.  4.  Colonoscopy: Normal colonoscopy with Dr. Collene Mares 08/2009.  Due later this year.  No family history of colon cancer.   5.  Cholesterol/Glucose:  Lipid panel at goal 01/2019.  Diabetic and A1C in November 2019 6.3%   Lipid Panel     Component Value Date/Time   CHOL 119 02/07/2019 0939   TRIG 75 02/07/2019 0939   HDL 45 02/07/2019 0939   CHOLHDL 2.6 08/18/2013 1008   VLDL 71 (H) 08/18/2013 1008   LDLCALC 59 02/07/2019 0939     6.  Immunizations:  Immunization History  Administered Date(s) Administered  . Influenza,inj,Quad PF,6+ Mos 08/18/2013  . Influenza-Unspecified 11/23/2015, 09/30/2018  . Tdap 12/15/2008     Current Meds  Medication Sig  . atorvastatin (LIPITOR) 80 MG tablet TAKE 1/2 TABLET BY MOUTH ONCE A DAY WITH EVENING MEAL  . cetirizine (ZYRTEC) 10 MG tablet Take 1 tablet (10 mg total) by mouth daily.  . cyclobenzaprine (FLEXERIL) 5 MG tablet 1-2 tabs every 8 hours as needed for muscle/back pain  . gabapentin (NEURONTIN) 300 MG capsule 1 cap by mouth twice daily  . metFORMIN (GLUCOPHAGE-XR) 500 MG 24 hr tablet TAKE 1 TABLET BY MOUTH DAILY WITH BREAKFAST  . [DISCONTINUED] atorvastatin (LIPITOR) 40 MG tablet 1 tab by mouth daily with evening meal   Allergies  Allergen Reactions  . Meloxicam Shortness Of Breath    Not clear if true allergy--started out as shortness of breath only when bending over   . Naproxen Shortness Of Breath   Past Medical History:  Diagnosis Date  . 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  Previously  followed by Dr. Brien Few for chronic pain.  Epidural injections help, but pt. concernd with having too many and they hurt.  . Dental decay   . Diabetes type 2, controlled ([place])   . Hyperlipidemia 2017  . Presbyopia    Past Surgical History:  Procedure Laterality Date  . BUNIONECTOMY Left 2005   [place]  . low back surgery  2012   Dr. Saintclair Halsted:  L4-5 and L5-S1 decompression and stabilization surgery   Family History  Problem Relation Age of Onset  . Heart disease Father 50       MI-cause of death  . Hypertension Father   . Stroke Father   . Hypertension Mother   . Diabetes Mother   . Heart disease Mother        MI cause of death  . Cancer Sister        Breast  . Cancer Sister        Breast  . Hypertension Brother   . Hypertension Brother   . Heart disease Brother        2 MIs and CABG   Social History   Socioeconomic History  . Marital status: Married    Spouse name: Noal Abshier  . Number of children: 3  . Years of education: 10  .  Highest education level: Not on file  Occupational History  . 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 Financial resource strain: Not very hard  . Food insecurity    Worry: Never true    Inability: Never true  . Transportation needs    Medical: No    Non-medical: No  Tobacco Use  . Smoking status: Never Smoker  . Smokeless tobacco: Current User    Types: Chew  . Tobacco comment: still using dip twice daily.  Bored and dips. Limited by back  Substance and Sexual Activity  . Alcohol use: Yes    Alcohol/week: 0.0 standard drinks    Comment: Beer 2-3 in a weekend  . Drug use: No  . Sexual activity: Yes    Comment: wife post menopausal  Lifestyle  . Physical activity    Days per week: 2 days    Minutes per session: 30 min  . Stress: Only a little  Relationships  . Social connections    Talks on phone: More than three times a week    Gets  together: Never    Attends religious service: Never    Active member of club or organization: Not on file    Attends meetings of clubs or organizations: Not on file    Relationship status: Married  . Intimate partner violence    Fear of current or ex partner: No    Emotionally abused: No    Physically abused: No    Forced sexual activity: No  Other Topics Concern  . Not on file  Social History Narrative   Lives off of [id number] with aunt, wife, 2 young grandchildren.  Mother of children lives on [place].   Has 3 children he considers his own, but are biologically his wife's and her previous partner's   Unable to work due to chronic back issues.     Review of Systems  Constitutional: Negative for appetite change, fatigue and fever.  HENT: Negative for dental problem, hearing loss, rhinorrhea, sinus pressure and sore throat.   Eyes: Negative for visual disturbance (uses reading glasees).  Respiratory: Negative for cough and shortness of breath.   Cardiovascular: Negative for chest pain, palpitations and leg swelling.  Gastrointestinal: Negative for abdominal pain, blood in stool (No melena), constipation and diarrhea.  Genitourinary: Negative for decreased urine volume, dysuria and frequency.  Musculoskeletal: Positive for back pain (Chronic back pain.). Negative for arthralgias.       Right great toenail bothers him. Still gets foot cramps in sleep.   Ivory soap at foot of bed has not helped.  Neurological: Negative for weakness and numbness.  Psychiatric/Behavioral: Negative for dysphoric mood. The patient is not nervous/anxious.       Objective:   BP 122/82 (BP Location: Left Arm, Patient Position: Sitting, Cuff Size: Normal)   Pulse 78   Resp 12   Ht 5\' 3"  (1.6 m)   Wt 155 lb (70.3 kg)   BMI 27.46 kg/m   Physical Exam  Constitutional: He is oriented to person, place, and time. He appears well-developed and well-nourished.  HENT:  Head: Normocephalic and  atraumatic.  Right Ear: Hearing, tympanic membrane, external ear and ear canal normal.  Left Ear: Hearing, tympanic membrane, external ear and ear canal normal.  Nose: Nose normal.  Mouth/Throat: Uvula is midline, oropharynx is clear and moist and mucous membranes are normal.  Eyes: Pupils are equal, round,  and reactive to light. Conjunctivae and EOM are normal.  Discs sharp bilaterally  Neck: Normal range of motion and full passive range of motion without pain. Neck supple. No thyromegaly present.  Cardiovascular: Normal rate, regular rhythm, S1 normal and S2 normal. Exam reveals no S3, no S4 and no friction rub.  No murmur heard. No carotid bruits.  Carotid, radial, femoral, DP and PT pulses normal and equal.   Pulmonary/Chest: Effort normal and breath sounds normal.  Abdominal: Soft. Bowel sounds are normal. He exhibits no mass. There is no hepatosplenomegaly. There is no abdominal tenderness. No hernia.  Genitourinary:    Penis normal.  Right testis shows no mass and no tenderness. Right testis is descended. Left testis shows no mass and no tenderness. Left testis is descended.    Genitourinary Comments: Testicles with small volume bilaterally   Musculoskeletal: Normal range of motion.  Lymphadenopathy:       Head (right side): No submental and no submandibular adenopathy present.       Head (left side): No submental and no submandibular adenopathy present.    He has no cervical adenopathy.    He has no axillary adenopathy.       Right: No inguinal and no supraclavicular adenopathy present.       Left: No inguinal and no supraclavicular adenopathy present.  Neurological: He is alert and oriented to person, place, and time. He has normal strength and normal reflexes. He displays normal reflexes. No cranial nerve deficit or sensory deficit. Coordination and gait normal.  Diabetic foot exam was performed with the following findings:   No deformities, ulcerations, or other skin breakdown  Normal sensation of 10g monofilament Intact posterior tibialis and dorsalis pedis pulses Left with repaired bunion. Right with bunion. Medial right great toenail bed ingrown.  Mild swelling and crusting.     Skin: Skin is warm. No rash noted.  Psychiatric: He has a normal mood and affect. His speech is normal and behavior is normal. Judgment and thought content normal. Cognition and memory are normal.   Assessment & Plan   1.  CPE PSA,CBC Pneumococcal 23 vaccination. Flu vaccine in fall  2.  Foot cramps:  Try tonic water at bedtime.  3.  DM:  Fasting labs in 2 months:  A1C, FLP, urine microalbumin, CMP Diabetic eye exam referral.  4.  Hyperlipidemia:  Labs as above.  5.  Ingrown toenail:  Call if wants removal.

## 2019-06-11 LAB — CBC WITH DIFFERENTIAL/PLATELET
Basophils Absolute: 0 10*3/uL (ref 0.0–0.2)
Basos: 0 %
EOS (ABSOLUTE): 0.1 10*3/uL (ref 0.0–0.4)
Eos: 1 %
Hematocrit: 41.3 % (ref 37.5–51.0)
Hemoglobin: 13.7 g/dL (ref 13.0–17.7)
Immature Grans (Abs): 0 10*3/uL (ref 0.0–0.1)
Immature Granulocytes: 0 %
Lymphocytes Absolute: 2.5 10*3/uL (ref 0.7–3.1)
Lymphs: 36 %
MCH: 29.3 pg (ref 26.6–33.0)
MCHC: 33.2 g/dL (ref 31.5–35.7)
MCV: 88 fL (ref 79–97)
Monocytes Absolute: 0.6 10*3/uL (ref 0.1–0.9)
Monocytes: 9 %
Neutrophils Absolute: 3.6 10*3/uL (ref 1.4–7.0)
Neutrophils: 54 %
Platelets: 259 10*3/uL (ref 150–450)
RBC: 4.67 x10E6/uL (ref 4.14–5.80)
RDW: 13 % (ref 11.6–15.4)
WBC: 6.8 10*3/uL (ref 3.4–10.8)

## 2019-06-11 LAB — PSA: Prostate Specific Ag, Serum: 1.1 ng/mL (ref 0.0–4.0)

## 2019-06-20 ENCOUNTER — Telehealth: Payer: Self-pay | Admitting: Internal Medicine

## 2019-06-20 DIAGNOSIS — J302 Other seasonal allergic rhinitis: Secondary | ICD-10-CM

## 2019-06-20 MED ORDER — ATORVASTATIN CALCIUM 80 MG PO TABS: ORAL_TABLET | ORAL | 6 refills | Status: DC

## 2019-06-20 MED ORDER — GABAPENTIN 300 MG PO CAPS: ORAL_CAPSULE | ORAL | 6 refills | Status: DC

## 2019-06-20 NOTE — Telephone Encounter (Signed)
Patient informed and will come tomorrow to pick up coupon.

## 2019-06-20 NOTE — Telephone Encounter (Signed)
See telephone note.

## 2019-06-21 MED ORDER — CETIRIZINE HCL 10 MG PO TABS: 10.0000 mg | ORAL_TABLET | Freq: Every day | ORAL | 6 refills | Status: DC

## 2019-06-21 MED ORDER — CYCLOBENZAPRINE HCL 5 MG PO TABS: ORAL_TABLET | ORAL | 2 refills | Status: DC

## 2019-06-21 MED ORDER — METFORMIN HCL ER 500 MG PO TB24: ORAL_TABLET | ORAL | 6 refills | Status: DC

## 2019-06-21 NOTE — Telephone Encounter (Signed)
Patient called stating needs Cetirizine, Cyclobenzaprine and Metformin to be called in to a different pharmacy than [facility]; he said meds will last until Monday. Patient will like meds to go to a place where can gets them at low cost or if can has a coupon he can uses for them.  Please advise.

## 2019-06-21 NOTE — Telephone Encounter (Signed)
Called and let patient know his Cyclobenzaprine and Cetirizine Rx sent to [facility] [address]. I sent texts for the coupons to his phone.   He will call if he does not receive. Metformin ER Rx went to [facility].

## 2019-06-21 NOTE — Telephone Encounter (Signed)
Please remind Parker Harvey to pick up coupons for Rx sent to [facility] yesterday--they are with [facility] up front.   If he calls and states he did not receive his coupons for 2 of the 3 sent to [facility] today, let me know and I will send again

## 2019-06-24 NOTE — Telephone Encounter (Signed)
Pt. Picked up coupons

## 2019-06-27 ENCOUNTER — Other Ambulatory Visit (INDEPENDENT_AMBULATORY_CARE_PROVIDER_SITE_OTHER): Payer: Medicaid Other

## 2019-06-27 ENCOUNTER — Telehealth: Payer: Self-pay | Admitting: Internal Medicine

## 2019-06-27 DIAGNOSIS — Z1211 Encounter for screening for malignant neoplasm of colon: Secondary | ICD-10-CM

## 2019-06-27 LAB — POC HEMOCCULT BLD/STL (HOME/3-CARD/SCREEN)
Card #2 Fecal Occult Blod, POC: NEGATIVE
Card #3 Fecal Occult Blood, POC: NEGATIVE
Fecal Occult Blood, POC: NEGATIVE

## 2019-06-27 NOTE — Telephone Encounter (Signed)
Patient called to inform Dr. Amil Amen received Medicaid Card today.  Please advise.

## 2019-06-27 NOTE — Telephone Encounter (Signed)
Noted. All patient Rx's have already been switched to [facility]

## 2019-07-04 NOTE — Addendum Note (Signed)
Addended by: Marcelino Duster on: 07/04/2019 10:13 AM   Modules accepted: Orders

## 2019-07-29 ENCOUNTER — Ambulatory Visit: Payer: Medicaid Other | Admitting: Internal Medicine

## 2019-07-29 ENCOUNTER — Encounter: Payer: Self-pay | Admitting: Internal Medicine

## 2019-07-29 ENCOUNTER — Other Ambulatory Visit: Payer: Self-pay

## 2019-07-29 VITALS — BP 130/82 | HR 74 | Resp 12 | Ht 63.0 in | Wt 160.0 lb

## 2019-07-29 DIAGNOSIS — L600 Ingrowing nail: Secondary | ICD-10-CM

## 2019-07-29 NOTE — Progress Notes (Signed)
    Subjective:    Patient ID: Mallie Snooks, male   DOB: [birth year], 58 y.o.   MRN: [id number]   HPI   Here for partial removal of ingrown toenail, right great toe. Informed consent obtained.  Current Meds  Medication Sig  . atorvastatin (LIPITOR) 80 MG tablet TAKE 1/2 TABLET BY MOUTH ONCE A DAY WITH EVENING MEAL  . cetirizine (ZYRTEC) 10 MG tablet Take 1 tablet (10 mg total) by mouth daily.  . cyclobenzaprine (FLEXERIL) 5 MG tablet 1-2 tabs every 8 hours as needed for muscle/back pain  . gabapentin (NEURONTIN) 300 MG capsule 1 cap by mouth twice daily  . metFORMIN (GLUCOPHAGE-XR) 500 MG 24 hr tablet TAKE 1 TABLET BY MOUTH DAILY WITH BREAKFAST   Allergies  Allergen Reactions  . Meloxicam Shortness Of Breath    Not clear if true allergy--started out as shortness of breath only when bending over   . Naproxen Shortness Of Breath     Review of Systems    Objective:   BP 130/82 (BP Location: Left Arm, Patient Position: Sitting, Cuff Size: Normal)   Pulse 74   Resp 12   Ht 5\' 3"  (1.6 m)   Wt 160 lb (72.6 kg)   BMI 28.34 kg/m   Physical Exam  Procedure note:  Right great toe cleaned and draped in sterile fashion. 2% lidocaine 7 ml used for digital block. With sharp excision, ingrown medial toenails removed through base.  Granulomatous tissue overlying and base of nail area treated with silver nitrate. Good hemostasis. Fluffy dressing applied. Patient tolerated well without complication.  Assessment & Plan   Ingrown right great toenail.  Ingrown aspect excised. To keep foot elevated  Tylenol 1000 mg 3 times daily over next 48 hours as needed for pain. Open toed shoe. Call for signs of infection.

## 2019-07-29 NOTE — Patient Instructions (Signed)
Keep foot elevated Keep toe covered and change dressing daily. You can take a shower, but do not rub are dry--pat dry Call for redness, swelling, increased pain or fever.

## 2019-08-11 ENCOUNTER — Other Ambulatory Visit (INDEPENDENT_AMBULATORY_CARE_PROVIDER_SITE_OTHER): Payer: Medicaid Other

## 2019-08-11 ENCOUNTER — Other Ambulatory Visit: Payer: Self-pay

## 2019-08-11 DIAGNOSIS — E119 Type 2 diabetes mellitus without complications: Secondary | ICD-10-CM | POA: Diagnosis not present

## 2019-08-11 DIAGNOSIS — Z79899 Other long term (current) drug therapy: Secondary | ICD-10-CM

## 2019-08-11 DIAGNOSIS — E782 Mixed hyperlipidemia: Secondary | ICD-10-CM

## 2019-08-14 LAB — COMPREHENSIVE METABOLIC PANEL
ALT: 45 IU/L — ABNORMAL HIGH (ref 0–44)
AST: 48 IU/L — ABNORMAL HIGH (ref 0–40)
Albumin/Globulin Ratio: 1.7 (ref 1.2–2.2)
Albumin: 4.8 g/dL (ref 3.8–4.9)
Alkaline Phosphatase: 85 IU/L (ref 39–117)
BUN/Creatinine Ratio: 8 — ABNORMAL LOW (ref 9–20)
BUN: 10 mg/dL (ref 6–24)
Bilirubin Total: 0.6 mg/dL (ref 0.0–1.2)
CO2: 23 mmol/L (ref 20–29)
Calcium: 9.9 mg/dL (ref 8.7–10.2)
Chloride: 100 mmol/L (ref 96–106)
Creatinine, Ser: 1.21 mg/dL (ref 0.76–1.27)
GFR calc Af Amer: 77 mL/min/{1.73_m2} (ref >59)
GFR calc non Af Amer: 67 mL/min/{1.73_m2} (ref >59)
Globulin, Total: 2.8 g/dL (ref 1.5–4.5)
Glucose: 95 mg/dL (ref 65–99)
Potassium: 4.5 mmol/L (ref 3.5–5.2)
Sodium: 141 mmol/L (ref 134–144)
Total Protein: 7.6 g/dL (ref 6.0–8.5)

## 2019-08-14 LAB — HGB A1C W/O EAG: Hgb A1c MFr Bld: 6.3 % — ABNORMAL HIGH (ref 4.8–5.6)

## 2019-08-14 LAB — MICROALBUMIN / CREATININE URINE RATIO
Creatinine, Urine: 233.4 mg/dL
Microalb/Creat Ratio: 7 mg/g creat (ref 0–29)
Microalbumin, Urine: 15.3 ug/mL

## 2019-08-14 LAB — LIPID PANEL W/O CHOL/HDL RATIO
Cholesterol, Total: 134 mg/dL (ref 100–199)
HDL: 48 mg/dL (ref >39)
LDL Calculated: 63 mg/dL (ref 0–99)
Triglycerides: 115 mg/dL (ref 0–149)
VLDL Cholesterol Cal: 23 mg/dL (ref 5–40)

## 2019-10-14 ENCOUNTER — Encounter: Payer: Self-pay | Admitting: Internal Medicine

## 2019-12-02 ENCOUNTER — Other Ambulatory Visit: Payer: Self-pay

## 2019-12-06 ENCOUNTER — Other Ambulatory Visit: Payer: Self-pay

## 2019-12-06 ENCOUNTER — Other Ambulatory Visit (INDEPENDENT_AMBULATORY_CARE_PROVIDER_SITE_OTHER): Payer: Medicaid Other

## 2019-12-06 DIAGNOSIS — Z79899 Other long term (current) drug therapy: Secondary | ICD-10-CM | POA: Diagnosis not present

## 2019-12-07 LAB — HEPATIC FUNCTION PANEL
ALT: 25 IU/L (ref 0–44)
AST: 22 IU/L (ref 0–40)
Albumin: 4.5 g/dL (ref 3.8–4.9)
Alkaline Phosphatase: 83 IU/L (ref 39–117)
Bilirubin Total: 0.9 mg/dL (ref 0.0–1.2)
Bilirubin, Direct: 0.19 mg/dL (ref 0.00–0.40)
Total Protein: 7.6 g/dL (ref 6.0–8.5)

## 2019-12-13 ENCOUNTER — Other Ambulatory Visit: Payer: Self-pay

## 2019-12-13 ENCOUNTER — Ambulatory Visit: Payer: Medicaid Other | Admitting: Internal Medicine

## 2019-12-13 ENCOUNTER — Encounter: Payer: Self-pay | Admitting: Internal Medicine

## 2019-12-13 VITALS — BP 134/82 | HR 86 | Resp 12 | Ht 63.0 in | Wt 160.0 lb

## 2019-12-13 DIAGNOSIS — L600 Ingrowing nail: Secondary | ICD-10-CM | POA: Diagnosis not present

## 2019-12-13 DIAGNOSIS — Z23 Encounter for immunization: Secondary | ICD-10-CM

## 2019-12-13 DIAGNOSIS — E119 Type 2 diabetes mellitus without complications: Secondary | ICD-10-CM

## 2019-12-13 DIAGNOSIS — R748 Abnormal levels of other serum enzymes: Secondary | ICD-10-CM

## 2019-12-13 DIAGNOSIS — E782 Mixed hyperlipidemia: Secondary | ICD-10-CM

## 2019-12-13 DIAGNOSIS — B351 Tinea unguium: Secondary | ICD-10-CM

## 2019-12-13 MED ORDER — BUTENAFINE HCL 1 % EX CREA: TOPICAL_CREAM | CUTANEOUS | 0 refills | Status: DC

## 2019-12-13 NOTE — Patient Instructions (Addendum)
Good job with alcohol!!  Liver enzymes are normal now!  Get Butenafine and work into right great toenail and left ring finger nailbed twice daily.  Follow with Tea Tree oil (both are over the counter--no need for prescription)  Call if you do not hear about referral for a colonoscopy in 1 month.

## 2019-12-13 NOTE — Progress Notes (Signed)
    Subjective:    Patient ID: Parker Harvey, male   DOB: 06-20-1961, 58 y.o.   MRN: [id number]   HPI   1.  Elevated transaminases:  Has stopped drinking alcohol now for 61 days and transaminases finally dropped to normal levels.  Discussed the alcohol is thus the likely culprit.  2.  Left ring finger nail crumbling and intermittent paronychium with itching.  States he has had a problem with this for about 2 years.  Cannot recall it feeling better when treated for toenail onychomycosis with 90 days of Terbinafine fall of 2019.  His feet and toenails resolved with infection with that treatment.    3.  DM:  A1C has been stable this year at 6.3%.  Has been eating Christmas candy recently.    4.  Right ingrown toenail:  States doing nicely since partial excision.    5.  HM:  Did not get referral for screening colonoscopy sent in July.  Received Medicaid  1 month later.    6.  Hyperlipidemia:  At goal in the fall.    Current Meds  Medication Sig  . atorvastatin (LIPITOR) 80 MG tablet TAKE 1/2 TABLET BY MOUTH ONCE A DAY WITH EVENING MEAL  . cetirizine (ZYRTEC) 10 MG tablet Take 1 tablet (10 mg total) by mouth daily.  . cyclobenzaprine (FLEXERIL) 5 MG tablet 1-2 tabs every 8 hours as needed for muscle/back pain  . gabapentin (NEURONTIN) 300 MG capsule 1 cap by mouth twice daily  . metFORMIN (GLUCOPHAGE-XR) 500 MG 24 hr tablet TAKE 1 TABLET BY MOUTH DAILY WITH BREAKFAST   Allergies  Allergen Reactions  . Meloxicam Shortness Of Breath    Not clear if true allergy--started out as shortness of breath only when bending over   . Naproxen Shortness Of Breath     Review of Systems    Objective:   BP 134/82 (BP Location: Left Arm, Patient Position: Sitting, Cuff Size: Normal)   Pulse 86   Resp 12   Ht 5\' 3"  (1.6 m)   Wt 160 lb (72.6 kg)   BMI 28.34 kg/m   Physical Exam  Lungs:  CTA CV:  RRR without murmur or rub.  Radial pulses normal and equal. Abd:  S, NT, No HSM Left ring  finger with thickening, horizontal rippling and mild skin inflammation and flaking around nail bed with cuticle not attached to nail at base. Right great toenail flattened, but developing mild callus of skin at medial nailbed with some crusting along same side nail bed.   Assessment & Plan   1.  Elevated hepatic transaminases:  Much improved with abstinence from ETOH.  Encouraged him to continue without alcohol.  2.  Ingrown right great toenail:  Toenail looks fine, but skin at medial edge starting to callus up again--to apply Butenafine cream twice daily followed by Tea Tree Oil  Discussed may need to do this for about 2 months or more.  3.  Left Ring finger onychomycosis:  As above.  If does not help, will need him to grow out fingernail so can send a sample for fungal culture as did not respond to Terbinafine 90 day treatment in past.  4.  DM:  A1C  5.  Hyperlipidemia:  Controlled.  Continue Atorvastatin.  6.  HM:  Need to recheck on screening colonoscopy referral.  Td today.  Followup in 6 months for CPE

## 2019-12-14 LAB — HGB A1C W/O EAG: Hgb A1c MFr Bld: 6.5 % — ABNORMAL HIGH (ref 4.8–5.6)

## 2020-01-09 ENCOUNTER — Encounter: Payer: Self-pay | Admitting: Internal Medicine

## 2020-01-17 ENCOUNTER — Other Ambulatory Visit: Payer: Self-pay | Admitting: Internal Medicine

## 2020-01-25 ENCOUNTER — Ambulatory Visit: Payer: Medicaid Other | Attending: Internal Medicine

## 2020-01-25 DIAGNOSIS — Z20822 Contact with and (suspected) exposure to covid-19: Secondary | ICD-10-CM | POA: Insufficient documentation

## 2020-01-26 LAB — NOVEL CORONAVIRUS, NAA: SARS-CoV-2, NAA: NOT DETECTED

## 2020-04-16 ENCOUNTER — Encounter: Payer: Self-pay | Admitting: Internal Medicine

## 2020-04-16 ENCOUNTER — Ambulatory Visit: Payer: Medicaid Other | Admitting: Internal Medicine

## 2020-04-16 ENCOUNTER — Other Ambulatory Visit: Payer: Self-pay

## 2020-04-16 VITALS — BP 148/100 | HR 74 | Resp 12 | Ht 63.0 in | Wt 152.2 lb

## 2020-04-16 DIAGNOSIS — M545 Low back pain, unspecified: Secondary | ICD-10-CM

## 2020-04-16 MED ORDER — PREDNISONE 10 MG PO TABS: ORAL_TABLET | ORAL | 0 refills | Status: DC

## 2020-04-16 MED ORDER — ACCU-CHEK SOFTCLIX LANCETS MISC: 11 refills | Status: AC

## 2020-04-16 MED ORDER — ACCU-CHEK AVIVA PLUS W/DEVICE KIT: PACK | 0 refills | Status: AC

## 2020-04-16 MED ORDER — ACCU-CHEK AVIVA PLUS VI STRP: ORAL_STRIP | 11 refills | Status: AC

## 2020-04-16 NOTE — Patient Instructions (Addendum)
Prednisone 10 mg tabs  Days 1-4:  2 tabs or 20 mg once daily  Day 5:  1 1/2 tab  Day 6:  1 tab  Day 7:  1/2 tab  Day 8:  Off.  [facility] [address] [place], [address] [place], [place]  [address]  [phone]

## 2020-04-16 NOTE — Progress Notes (Signed)
    Subjective:    Patient ID: Parker Harvey, male   DOB: Mar 05, 1961, 59 y.o.   MRN: [id number]   HPI   Larey Seat 2 days ago on the ground on his butt/sacral area.  Was fine initially, but yesterday evening started to have pain in low sacral area--not directly on coccyx in his estimation.  No numbness and tingling in legs.  Hurts to move his left leg.  Hard to get up and down in sitting position.  Pain with laughing and coughing.   Did not seem like a hard fall at the time.  Last A1C in December was 6.5%.  Does not check sugars.   Current Meds  Medication Sig  . atorvastatin (LIPITOR) 80 MG tablet TAKE 1/2 TABLET BY MOUTH ONCE A DAY WITH EVENING MEAL  . Butenafine HCl 1 % cream Apply twice daily to left ring finger nailbed and surrounding skin and about right great toenail bed until inflammation resolved.  May be weeks.  . cetirizine (ZYRTEC) 10 MG tablet Take 1 tablet (10 mg total) by mouth daily.  . cyclobenzaprine (FLEXERIL) 5 MG tablet 1-2 tabs every 8 hours as needed for muscle/back pain  . gabapentin (NEURONTIN) 300 MG capsule TAKE 1 CAPSULE BY MOUTH 2 TIMES A DAY  . metFORMIN (GLUCOPHAGE-XR) 500 MG 24 hr tablet TAKE 1 TABLET BY MOUTH DAILY WITH BREAKFAST   Allergies  Allergen Reactions  . Meloxicam Shortness Of Breath    Not clear if true allergy--started out as shortness of breath only when bending over   . Naproxen Shortness Of Breath     Review of Systems    Objective:   Vitals:   04/16/20 1434  BP: (!) 148/100  Pulse: 74  Resp: 12  Height: 5\' 3"  (1.6 m)  Weight: 152 lb 4 oz (69.1 kg)  BMI (Calculated): 26.98   Physical Exam Lungs:  CTA CV:  RRR without murmur or rub. Spine:  Very tender over entire length of L/S spinous processes and coccyx.  Tender over paraspinous musculature as well. Motor 5/5, DTRs 2+/4.  Very stiff moving after sitting for a bit.  Assessment & Plan   Acute on chronic back pain following recent fall:  Xray of L/S spine. Prednisone 20  mg daily for 4 days, then taper by 5 mg daily until off--watch blood glucose. Has a follow up appt next month to recheck bp--likely high due to discomfort. Continue cyclobenzaprine.   No significant discomfort directly against coccyx with sitting, but would use cushioned chair seat

## 2020-04-17 ENCOUNTER — Ambulatory Visit
Admission: RE | Admit: 2020-04-17 | Discharge: 2020-04-17 | Disposition: A | Discharge status: Home / Self Care | Payer: Medicaid Other | Source: Ambulatory Visit | Attending: Internal Medicine | Admitting: Internal Medicine

## 2020-04-17 DIAGNOSIS — M545 Low back pain, unspecified: Secondary | ICD-10-CM

## 2020-06-13 ENCOUNTER — Encounter: Payer: Medicaid Other | Admitting: Internal Medicine

## 2020-07-16 ENCOUNTER — Encounter: Payer: Medicaid Other | Admitting: Internal Medicine

## 2020-07-16 ENCOUNTER — Other Ambulatory Visit: Payer: Self-pay | Admitting: Internal Medicine

## 2020-07-17 ENCOUNTER — Other Ambulatory Visit (INDEPENDENT_AMBULATORY_CARE_PROVIDER_SITE_OTHER): Payer: Medicaid Other

## 2020-07-17 DIAGNOSIS — Z20822 Contact with and (suspected) exposure to covid-19: Secondary | ICD-10-CM

## 2020-07-17 LAB — POC COVID19 BINAXNOW: SARS Coronavirus 2 Ag: NEGATIVE

## 2020-08-13 ENCOUNTER — Encounter: Payer: Self-pay | Admitting: Internal Medicine

## 2020-08-13 ENCOUNTER — Ambulatory Visit (INDEPENDENT_AMBULATORY_CARE_PROVIDER_SITE_OTHER): Payer: Medicaid Other | Admitting: Internal Medicine

## 2020-08-13 VITALS — BP 122/70 | HR 80 | Resp 12 | Ht 63.0 in | Wt 158.0 lb

## 2020-08-13 DIAGNOSIS — Z1211 Encounter for screening for malignant neoplasm of colon: Secondary | ICD-10-CM

## 2020-08-13 DIAGNOSIS — E782 Mixed hyperlipidemia: Secondary | ICD-10-CM

## 2020-08-13 DIAGNOSIS — H9190 Unspecified hearing loss, unspecified ear: Secondary | ICD-10-CM

## 2020-08-13 DIAGNOSIS — E119 Type 2 diabetes mellitus without complications: Secondary | ICD-10-CM | POA: Diagnosis not present

## 2020-08-13 DIAGNOSIS — Z79899 Other long term (current) drug therapy: Secondary | ICD-10-CM

## 2020-08-13 NOTE — Patient Instructions (Signed)
Can google "advance directives, [place]"  And bring up form from Secretary of State. Print and fill out Or can go to "5 wishes"  Which is also in Spanish and fill out--this costs $5--perhaps easier to use. Designate a Medical Power of Attorney to speak for you if you are unable to speak for yourself when ill or injured  

## 2020-08-13 NOTE — Progress Notes (Signed)
Subjective:    Patient ID: Parker Harvey, male   DOB: 1961/09/08, 59 y.o.   MRN: [id number]   HPI   Here for Male CPE:  1.  STE:  Does perform.  No changes.  No family history of testicular cancer.    2.  PSA/DRE:  PSA 1.1 and stable 06/10/2019.  No family history of prostate cancer.    3.  Guaiac Cards:  Negative July 2020.    4.  Colonoscopy:  Last colonoscopy was in 2010 with Dr. Collene Mares.  No family history of colon cancer  5.  Cholesterol/Glucose:  Diabetes with last A1C 12/19 at 6.5%.  Last FLP at goal on 08/11/2019:  Lipid Panel     Component Value Date/Time   CHOL 134 08/11/2019 0911   TRIG 115 08/11/2019 0911   HDL 48 08/11/2019 0911   CHOLHDL 2.6 08/18/2013 1008   VLDL 71 (H) 08/18/2013 1008   LDLCALC 63 08/11/2019 0911   LABVLDL 23 08/11/2019 0911     6.  Immunizations:  Immunization History  Administered Date(s) Administered  . Influenza,inj,Quad PF,6+ Mos 08/18/2013  . Influenza-Unspecified 11/23/2015, 09/30/2018, 09/15/2019  . Janssen (J&J) SARS-COV-2 Vaccination 03/21/2020  . Pneumococcal Polysaccharide-23 06/10/2019  . Td 12/13/2019  . Tdap 12/15/2008     Current Meds  Medication Sig  . Accu-Chek Softclix Lancets lancets Check blood sugar twice daily before meals  . AGAMATRIX ULTRA-THIN LANCETS MISC Check blood glucose twice daily before meals as needed  . atorvastatin (LIPITOR) 80 MG tablet TAKE 1/2 TABLET BY MOUTH EVERY EVENING  . Blood Glucose Monitoring Suppl (ACCU-CHEK AVIVA PLUS) w/Device KIT Check blood sugar twice daily before meals  . Butenafine HCl 1 % cream Apply twice daily to left ring finger nailbed and surrounding skin and about right great toenail bed until inflammation resolved.  May be weeks.  . cetirizine (ZYRTEC) 10 MG tablet Take 1 tablet (10 mg total) by mouth daily.  . Cholecalciferol (VITAMIN D3) 25 MCG (1000 UT) CAPS Take by mouth. 1 daily  . cyclobenzaprine (FLEXERIL) 5 MG tablet 1-2 tabs every 8 hours as needed for  muscle/back pain  . gabapentin (NEURONTIN) 300 MG capsule TAKE 1 CAPSULE BY MOUTH 2 TIMES A DAY  . glucose blood (ACCU-CHEK AVIVA PLUS) test strip Check blood sugar twice daily before meals  . metFORMIN (GLUCOPHAGE-XR) 500 MG 24 hr tablet TAKE 1 TABLET BY MOUTH WITH BREAKFAST   Allergies  Allergen Reactions  . Meloxicam Shortness Of Breath    Not clear if true allergy--started out as shortness of breath only when bending over   . Naproxen Shortness Of Breath   Past Medical History:  Diagnosis Date  . 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  Previously followed by Dr. Brien Few for chronic pain.  Epidural injections help, but pt. concernd with having too many and they hurt.  . Dental decay   . Diabetes type 2, controlled ([place])   . Hyperlipidemia 2017  . Presbyopia     Past Surgical History:  Procedure Laterality Date  . BUNIONECTOMY Left 2005   [place]  . low back surgery  2012   Dr. Saintclair Halsted:  L4-5 and L5-S1 decompression and stabilization surgery   Family History  Problem Relation Age of Onset  . Heart disease Father 108       MI-cause of death  . Hypertension Father   . Stroke Father   . Hypertension Mother   .  Diabetes Mother   . Heart disease Mother        MI cause of death  . Cancer Sister        Breast  . Cancer Sister        Breast  . Hypertension Brother   . Hypertension Brother   . Heart disease Brother        2 MIs and CABG   Social History   Socioeconomic History  . Marital status: Married    Spouse name: Sebert Stollings  . Number of children: 3  . Years of education: 10  . Highest education level: Not on file  Occupational History  . 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 Smoking status: Former Smoker    Types: Cigarettes    Quit date: 12/15/1989    Years since quitting: 30.6  . Smokeless tobacco: Current User     Types: Chew  . Tobacco comment: still using dip twice daily.  Bored and dips. Limited by back  Vaping Use  . Vaping Use: Never used  Substance and Sexual Activity  . Alcohol use: Not Currently    Alcohol/week: 0.0 standard drinks    Comment: stopped in 2020.  Marland Kitchen Drug use: No  . Sexual activity: Yes    Comment: wife post menopausal  Other Topics Concern  . Not on file  Social History Narrative   Lives off [place] now with wife.    Has 3 children he considers his own, but are biologically his wife's and her previous partner's   Unable to work due to chronic back issues.   Social Determinants of Health   Financial Resource Strain: Low Risk   . Difficulty of Paying Living Expenses: Not very hard  Food Insecurity: No Food Insecurity  . Worried About Charity fundraiser in the Last Year: Never true  . Ran Out of Food in the Last Year: Never true  Transportation Needs: No Transportation Needs  . Lack of Transportation (Medical): No  . Lack of Transportation (Non-Medical): No  Physical Activity:   . Days of Exercise per Week: Not on file  . Minutes of Exercise per Session: Not on file  Stress:   . Feeling of Stress : Not on file  Social Connections:   . Frequency of Communication with Friends and Family: Not on file  . Frequency of Social Gatherings with Friends and Family: Not on file  . Attends Religious Services: Not on file  . Active Member of Clubs or Organizations: Not on file  . Attends Archivist Meetings: Not on file  . Marital Status: Not on file  Intimate Partner Violence: Not At Risk  . Fear of Current or Ex-Partner: No  . Emotionally Abused: No  . Physically Abused: No  . Sexually Abused: No     Review of Systems  Constitutional: Negative for appetite change, fatigue, fever and unexpected weight change.  HENT: Positive for hearing loss (Wife has mentioned a problem.  He recognizes as well.  ). Negative for dental problem (Has broken teeth that  need to be removed--has a dentist with Medicaid now. ), rhinorrhea, sneezing and sore throat.   Eyes: Negative for visual disturbance (Does wear reading glasses.  ).  Respiratory: Negative for cough and shortness of breath.   Cardiovascular: Negative for chest pain and palpitations.  Gastrointestinal: Negative for abdominal pain, blood in stool (No melena), constipation and diarrhea.  Genitourinary: Negative for decreased urine volume, dysuria, genital sores and testicular pain.  Musculoskeletal: Negative for arthralgias.       Occasional muscle cramps--helped by B complex  Neurological: Negative for weakness and numbness.  Psychiatric/Behavioral: Negative for dysphoric mood. The patient is not nervous/anxious.       Objective:   BP 122/70 (BP Location: Left Arm, Patient Position: Sitting, Cuff Size: Normal)   Pulse 80   Resp 12   Ht 5' 3"  (1.6 m)   Wt 158 lb (71.7 kg)   BMI 27.99 kg/m   Physical Exam Constitutional:      Appearance: Normal appearance.  HENT:     Head: Normocephalic and atraumatic.     Right Ear: Tympanic membrane, ear canal and external ear normal.     Left Ear: Tympanic membrane, ear canal and external ear normal.     Ears:     Comments: Need to repeat questions multiple times.    Nose: Nose normal.     Mouth/Throat:     Mouth: Mucous membranes are moist.     Pharynx: Oropharynx is clear.     Comments: Extensive dental decay with broken teeth Eyes:     Extraocular Movements: Extraocular movements intact.     Conjunctiva/sclera: Conjunctivae normal.     Pupils: Pupils are equal, round, and reactive to light.     Comments: Discs sharp bilaterally  Neck:     Thyroid: No thyroid mass or thyromegaly.  Cardiovascular:     Rate and Rhythm: Normal rate and regular rhythm.     Pulses:          Dorsalis pedis pulses are 2+ on the right side and 2+ on the left side.       Posterior tibial pulses are 2+ on the right side and 2+ on the left side.     Heart  sounds: S1 normal and S2 normal. No murmur heard.  No friction rub. No S3 or S4 sounds.      Comments: No carotid bruits.  Carotid, radial, femoral, DP and PT pulses normal and equal.  Pulmonary:     Effort: Pulmonary effort is normal.     Breath sounds: Normal breath sounds.  Abdominal:     General: Bowel sounds are normal.     Palpations: Abdomen is soft. There is no hepatomegaly, splenomegaly or mass.     Tenderness: There is no abdominal tenderness.     Hernia: No hernia is present.  Genitourinary:    Penis: Normal and circumcised.      Testes:        Right: Mass or tenderness not present. Right testis is descended.        Left: Mass or tenderness not present. Left testis is descended.  Musculoskeletal:        General: Normal range of motion.     Cervical back: Full passive range of motion without pain, normal range of motion and neck supple.  Feet:     Right foot:     Protective Sensation: 10 sites tested. 10 sites sensed.     Skin integrity: Skin integrity normal.     Toenail Condition: Right toenails are normal.     Left foot:     Protective Sensation: 10 sites tested. 10 sites sensed.     Toenail Condition: Left toenails are abnormally thick. Fungal disease present. Lymphadenopathy:     Head:     Right side of head: No submental or submandibular adenopathy.  Left side of head: No submental or submandibular adenopathy.     Cervical: No cervical adenopathy.     Upper Body:     Right upper body: No supraclavicular or axillary adenopathy.     Left upper body: No supraclavicular or axillary adenopathy.     Lower Body: No right inguinal adenopathy. No left inguinal adenopathy.  Skin:    General: Skin is warm.     Capillary Refill: Capillary refill takes less than 2 seconds.  Neurological:     Mental Status: He is alert and oriented to person, place, and time.     Cranial Nerves: Cranial nerves are intact.     Sensory: Sensation is intact.     Motor: Motor function is  intact.     Coordination: Coordination is intact.     Gait: Gait is intact.     Deep Tendon Reflexes: Reflexes are normal and symmetric.  Psychiatric:        Attention and Perception: Attention normal.        Mood and Affect: Mood normal.        Speech: Speech normal.        Behavior: Behavior normal.        Thought Content: Thought content normal.        Cognition and Memory: Cognition normal.        Judgment: Judgment normal.         Assessment & Plan   1.  CPE Referral for colonoscopy:  Dr. Collene Mares CBC, CMP, FLP, A1C, urine microalbumin/crea Schedule flu vaccine Encouraged to consider booster with J & J vaccine where he obtained before in December if booster ultimately recommended.  2.  DM:  Labs above Eye referral.  3.  Hyperlipidemia:  Labs above.  4.  Concern for decreased hearing:  [facility].  5.  Dental decay:  Encouraged him to make an appt with his dentist.

## 2020-08-14 LAB — CBC WITH DIFFERENTIAL/PLATELET
Basophils Absolute: 0 10*3/uL (ref 0.0–0.2)
Basos: 1 %
EOS (ABSOLUTE): 0.1 10*3/uL (ref 0.0–0.4)
Eos: 2 %
Hematocrit: 43.2 % (ref 37.5–51.0)
Hemoglobin: 14 g/dL (ref 13.0–17.7)
Immature Grans (Abs): 0 10*3/uL (ref 0.0–0.1)
Immature Granulocytes: 0 %
Lymphocytes Absolute: 2.1 10*3/uL (ref 0.7–3.1)
Lymphs: 39 %
MCH: 28.5 pg (ref 26.6–33.0)
MCHC: 32.4 g/dL (ref 31.5–35.7)
MCV: 88 fL (ref 79–97)
Monocytes Absolute: 0.5 10*3/uL (ref 0.1–0.9)
Monocytes: 9 %
Neutrophils Absolute: 2.6 10*3/uL (ref 1.4–7.0)
Neutrophils: 49 %
Platelets: 255 10*3/uL (ref 150–450)
RBC: 4.91 x10E6/uL (ref 4.14–5.80)
RDW: 12.8 % (ref 11.6–15.4)
WBC: 5.2 10*3/uL (ref 3.4–10.8)

## 2020-08-14 LAB — HGB A1C W/O EAG: Hgb A1c MFr Bld: 6.7 % — ABNORMAL HIGH (ref 4.8–5.6)

## 2020-08-14 LAB — COMPREHENSIVE METABOLIC PANEL
ALT: 19 IU/L (ref 0–44)
AST: 21 IU/L (ref 0–40)
Albumin/Globulin Ratio: 1.7 (ref 1.2–2.2)
Albumin: 4.7 g/dL (ref 3.8–4.9)
Alkaline Phosphatase: 96 IU/L (ref 48–121)
BUN/Creatinine Ratio: 8 — ABNORMAL LOW (ref 9–20)
BUN: 11 mg/dL (ref 6–24)
Bilirubin Total: 0.8 mg/dL (ref 0.0–1.2)
CO2: 23 mmol/L (ref 20–29)
Calcium: 10.1 mg/dL (ref 8.7–10.2)
Chloride: 104 mmol/L (ref 96–106)
Creatinine, Ser: 1.38 mg/dL — ABNORMAL HIGH (ref 0.76–1.27)
GFR calc Af Amer: 65 mL/min/{1.73_m2} (ref >59)
GFR calc non Af Amer: 56 mL/min/{1.73_m2} — ABNORMAL LOW (ref >59)
Globulin, Total: 2.8 g/dL (ref 1.5–4.5)
Glucose: 116 mg/dL — ABNORMAL HIGH (ref 65–99)
Potassium: 5.3 mmol/L — ABNORMAL HIGH (ref 3.5–5.2)
Sodium: 141 mmol/L (ref 134–144)
Total Protein: 7.5 g/dL (ref 6.0–8.5)

## 2020-08-14 LAB — MICROALBUMIN / CREATININE URINE RATIO
Creatinine, Urine: 164.8 mg/dL
Microalb/Creat Ratio: 4 mg/g creat (ref 0–29)
Microalbumin, Urine: 6.8 ug/mL

## 2020-08-14 LAB — LIPID PANEL W/O CHOL/HDL RATIO
Cholesterol, Total: 124 mg/dL (ref 100–199)
HDL: 46 mg/dL (ref >39)
LDL Chol Calc (NIH): 62 mg/dL (ref 0–99)
Triglycerides: 78 mg/dL (ref 0–149)
VLDL Cholesterol Cal: 16 mg/dL (ref 5–40)

## 2020-08-15 ENCOUNTER — Other Ambulatory Visit: Payer: Self-pay | Admitting: Internal Medicine

## 2020-08-21 ENCOUNTER — Other Ambulatory Visit: Payer: Self-pay | Admitting: Internal Medicine

## 2020-08-23 ENCOUNTER — Other Ambulatory Visit: Payer: Self-pay | Admitting: Internal Medicine

## 2020-09-07 ENCOUNTER — Other Ambulatory Visit (INDEPENDENT_AMBULATORY_CARE_PROVIDER_SITE_OTHER): Payer: Medicaid Other

## 2020-09-07 DIAGNOSIS — R7989 Other specified abnormal findings of blood chemistry: Secondary | ICD-10-CM

## 2020-09-08 LAB — BASIC METABOLIC PANEL
BUN/Creatinine Ratio: 11 (ref 9–20)
BUN: 13 mg/dL (ref 6–24)
CO2: 26 mmol/L (ref 20–29)
Calcium: 10 mg/dL (ref 8.7–10.2)
Chloride: 104 mmol/L (ref 96–106)
Creatinine, Ser: 1.22 mg/dL (ref 0.76–1.27)
GFR calc Af Amer: 76 mL/min/{1.73_m2} (ref >59)
GFR calc non Af Amer: 65 mL/min/{1.73_m2} (ref >59)
Glucose: 125 mg/dL — ABNORMAL HIGH (ref 65–99)
Potassium: 4.8 mmol/L (ref 3.5–5.2)
Sodium: 145 mmol/L — ABNORMAL HIGH (ref 134–144)

## 2021-02-12 ENCOUNTER — Ambulatory Visit: Payer: Medicaid Other | Admitting: Internal Medicine

## 2021-03-19 ENCOUNTER — Other Ambulatory Visit: Payer: Self-pay | Admitting: Internal Medicine

## 2021-03-22 ENCOUNTER — Telehealth: Payer: Self-pay | Admitting: Internal Medicine

## 2021-03-22 NOTE — Telephone Encounter (Signed)
Patient called requesting a refill of gabapentin (NEURONTIN) 300 MG capsule. Patient would like that to be send to [facility] on [address].

## 2021-03-28 NOTE — Telephone Encounter (Signed)
Spoke with patient and he confirmed that he picked up his RX. Also remind patient of Doctor recommendation.

## 2021-07-10 ENCOUNTER — Other Ambulatory Visit: Payer: Self-pay

## 2021-07-10 ENCOUNTER — Encounter: Payer: Self-pay | Admitting: Internal Medicine

## 2021-07-10 ENCOUNTER — Ambulatory Visit (INDEPENDENT_AMBULATORY_CARE_PROVIDER_SITE_OTHER): Payer: Medicaid Other | Admitting: Internal Medicine

## 2021-07-10 VITALS — BP 122/80 | HR 88 | Resp 20 | Ht 63.0 in | Wt 163.0 lb

## 2021-07-10 DIAGNOSIS — M545 Low back pain, unspecified: Secondary | ICD-10-CM

## 2021-07-10 DIAGNOSIS — E782 Mixed hyperlipidemia: Secondary | ICD-10-CM

## 2021-07-10 DIAGNOSIS — H9190 Unspecified hearing loss, unspecified ear: Secondary | ICD-10-CM | POA: Diagnosis not present

## 2021-07-10 DIAGNOSIS — B351 Tinea unguium: Secondary | ICD-10-CM | POA: Diagnosis not present

## 2021-07-10 DIAGNOSIS — E119 Type 2 diabetes mellitus without complications: Secondary | ICD-10-CM | POA: Diagnosis not present

## 2021-07-10 NOTE — Patient Instructions (Signed)
Bring your covid vaccine card to labs visit

## 2021-07-10 NOTE — Progress Notes (Signed)
    Subjective:    Patient ID: Parker Harvey, male   DOB: 1961-02-23, 60 y.o.   MRN: [id number]   HPI  Eye problem:  Really actually just having vivid dreams and then when awakens, feels he is still in the dream for a second.  Not causing any problem.  Did go to Dr. Katy Fitch and no diabetic changes.  He has not purchased the eyeglasses recommended.    2.  Hearing concerns:  went to [facility]--referred last August.  Was encouraged to consider getting a hearing aid.  He did not pursue that at the time, but feels he needs to now--missing what people are saying.    3.  DM:  Not checking sugars often.  Low 100s when checks.  Checks feet nightly.  Eye check with Dr. Katy Fitch earlier this year.  No DM changes as above.  A1C at goal last August.  Nonfasting today.  4.  Allergies:   controlled with meds.  5.  Low back pain:  stable on Gabapentin.    6.  Hypercholesterolemia:  at goal in August of 2021  Current Meds  Medication Sig   Accu-Chek Softclix Lancets lancets Check blood sugar twice daily before meals   AGAMATRIX ULTRA-THIN LANCETS MISC Check blood glucose twice daily before meals as needed   atorvastatin (LIPITOR) 80 MG tablet TAKE 1/2 TABLET BY MOUTH EVERY EVENING   Blood Glucose Monitoring Suppl (ACCU-CHEK AVIVA PLUS) w/Device KIT Check blood sugar twice daily before meals   cetirizine (ZYRTEC) 10 MG tablet TAKE 1 TABLET BY MOUTH EVERY DAY   Cholecalciferol (VITAMIN D3) 25 MCG (1000 UT) CAPS Take by mouth. 1 daily   cyclobenzaprine (FLEXERIL) 10 MG tablet TAKE 1/2 TO 1 TABLET BY MOUTH EVERY 8 HOURS FOR MUSCLE/BACK PAIN   gabapentin (NEURONTIN) 300 MG capsule TAKE 1 CAPSULE BY MOUTH 2 TIMES A DAY   glucose blood (ACCU-CHEK AVIVA PLUS) test strip Check blood sugar twice daily before meals   metFORMIN (GLUCOPHAGE-XR) 500 MG 24 hr tablet TAKE 1 TABLET BY MOUTH WITH BREAKFAST   Allergies  Allergen Reactions   Meloxicam Shortness Of Breath    Not clear if true allergy--started out as  shortness of breath only when bending over    Naproxen Shortness Of Breath     Review of Systems    Objective:   BP 122/80 (BP Location: Right Arm, Patient Position: Sitting, Cuff Size: Normal)   Pulse 88   Resp 20   Ht $R'5\' 3"'to$  (1.6 m)   Wt 163 lb (73.9 kg)   BMI 28.87 kg/m   Physical Exam NAD HEENT:  PERRL, EOMI, TMs pearly gray Neck:  Supple, No adenopathy Chest:  CTA CV:  RRR with normal S1 and S2, No S3, S4 or murmur.  No carotid burits.  Carotid, radial and DP pulses normal and equal. Abd:  S, NT, No HSM or mass, + BS LE:  No edema.  Assessment & Plan  1.  DM:  sounds well controlled.  A1C, Urine microalbumin/crea with fasting labs in next week.  2.  Hyperlipidemia:  FLP with labs  3.  Decreased hearing acuity:  referral back to [facility].  4.  HM:  PSA with labs.  To bring in [place] card with labs to see if needs booster.   Will bring covid vaccine card to lab visit.

## 2021-07-11 ENCOUNTER — Other Ambulatory Visit: Payer: Self-pay | Admitting: Internal Medicine

## 2021-07-30 ENCOUNTER — Other Ambulatory Visit (INDEPENDENT_AMBULATORY_CARE_PROVIDER_SITE_OTHER): Payer: Medicaid Other

## 2021-07-30 ENCOUNTER — Other Ambulatory Visit: Payer: Self-pay

## 2021-07-30 DIAGNOSIS — Z79899 Other long term (current) drug therapy: Secondary | ICD-10-CM

## 2021-07-30 DIAGNOSIS — R748 Abnormal levels of other serum enzymes: Secondary | ICD-10-CM | POA: Diagnosis not present

## 2021-07-30 DIAGNOSIS — E782 Mixed hyperlipidemia: Secondary | ICD-10-CM

## 2021-07-30 DIAGNOSIS — E119 Type 2 diabetes mellitus without complications: Secondary | ICD-10-CM | POA: Diagnosis not present

## 2021-07-30 DIAGNOSIS — Z125 Encounter for screening for malignant neoplasm of prostate: Secondary | ICD-10-CM

## 2021-07-31 LAB — COMPREHENSIVE METABOLIC PANEL
ALT: 41 IU/L (ref 0–44)
AST: 37 IU/L (ref 0–40)
Albumin/Globulin Ratio: 1.6 (ref 1.2–2.2)
Albumin: 4.7 g/dL (ref 3.8–4.9)
Alkaline Phosphatase: 93 IU/L (ref 44–121)
BUN/Creatinine Ratio: 8 — ABNORMAL LOW (ref 9–20)
BUN: 10 mg/dL (ref 6–24)
Bilirubin Total: 0.8 mg/dL (ref 0.0–1.2)
CO2: 24 mmol/L (ref 20–29)
Calcium: 9.9 mg/dL (ref 8.7–10.2)
Chloride: 101 mmol/L (ref 96–106)
Creatinine, Ser: 1.31 mg/dL — ABNORMAL HIGH (ref 0.76–1.27)
Globulin, Total: 2.9 g/dL (ref 1.5–4.5)
Glucose: 121 mg/dL — ABNORMAL HIGH (ref 65–99)
Potassium: 4.8 mmol/L (ref 3.5–5.2)
Sodium: 140 mmol/L (ref 134–144)
Total Protein: 7.6 g/dL (ref 6.0–8.5)
eGFR: 63 mL/min/{1.73_m2} (ref >59)

## 2021-07-31 LAB — CBC WITH DIFFERENTIAL/PLATELET
Basophils Absolute: 0 10*3/uL (ref 0.0–0.2)
Basos: 0 %
EOS (ABSOLUTE): 0.1 10*3/uL (ref 0.0–0.4)
Eos: 2 %
Hematocrit: 42.9 % (ref 37.5–51.0)
Hemoglobin: 13.8 g/dL (ref 13.0–17.7)
Immature Grans (Abs): 0 10*3/uL (ref 0.0–0.1)
Immature Granulocytes: 0 %
Lymphocytes Absolute: 2.8 10*3/uL (ref 0.7–3.1)
Lymphs: 45 %
MCH: 28.3 pg (ref 26.6–33.0)
MCHC: 32.2 g/dL (ref 31.5–35.7)
MCV: 88 fL (ref 79–97)
Monocytes Absolute: 0.6 10*3/uL (ref 0.1–0.9)
Monocytes: 10 %
Neutrophils Absolute: 2.7 10*3/uL (ref 1.4–7.0)
Neutrophils: 43 %
Platelets: 250 10*3/uL (ref 150–450)
RBC: 4.87 x10E6/uL (ref 4.14–5.80)
RDW: 13.1 % (ref 11.6–15.4)
WBC: 6.3 10*3/uL (ref 3.4–10.8)

## 2021-07-31 LAB — LIPID PANEL W/O CHOL/HDL RATIO
Cholesterol, Total: 130 mg/dL (ref 100–199)
HDL: 42 mg/dL (ref >39)
LDL Chol Calc (NIH): 72 mg/dL (ref 0–99)
Triglycerides: 80 mg/dL (ref 0–149)
VLDL Cholesterol Cal: 16 mg/dL (ref 5–40)

## 2021-07-31 LAB — HEMOGLOBIN A1C
Est. average glucose Bld gHb Est-mCnc: 166 mg/dL
Hgb A1c MFr Bld: 7.4 % — ABNORMAL HIGH (ref 4.8–5.6)

## 2021-07-31 LAB — MICROALBUMIN / CREATININE URINE RATIO

## 2021-07-31 LAB — PSA: Prostate Specific Ag, Serum: 0.7 ng/mL (ref 0.0–4.0)

## 2021-08-03 LAB — MICROALBUMIN / CREATININE URINE RATIO
Creatinine, Urine: 104.2 mg/dL
Microalb/Creat Ratio: 4 mg/g creat (ref 0–29)
Microalbumin, Urine: 4.5 ug/mL

## 2021-08-19 ENCOUNTER — Other Ambulatory Visit: Payer: Self-pay | Admitting: Internal Medicine

## 2021-08-28 ENCOUNTER — Other Ambulatory Visit: Payer: Self-pay

## 2021-09-30 ENCOUNTER — Other Ambulatory Visit: Payer: Self-pay | Admitting: Internal Medicine

## 2021-09-30 MED ORDER — METFORMIN HCL ER 500 MG PO TB24: ORAL_TABLET | ORAL | 11 refills | Status: DC

## 2021-10-16 ENCOUNTER — Other Ambulatory Visit: Payer: Self-pay | Admitting: Internal Medicine

## 2021-10-21 ENCOUNTER — Other Ambulatory Visit: Payer: Self-pay

## 2021-10-28 IMAGING — CR DG LUMBAR SPINE COMPLETE 4+V
5 series · 5 of 5 positions shown · non-contrast
Comparison: 09/03/2015

CLINICAL DATA: Fell, coccygeal pain, trauma 3 days ago

EXAM:
LUMBAR SPINE - COMPLETE 4+ VIEW

[t l-spine a.p.]
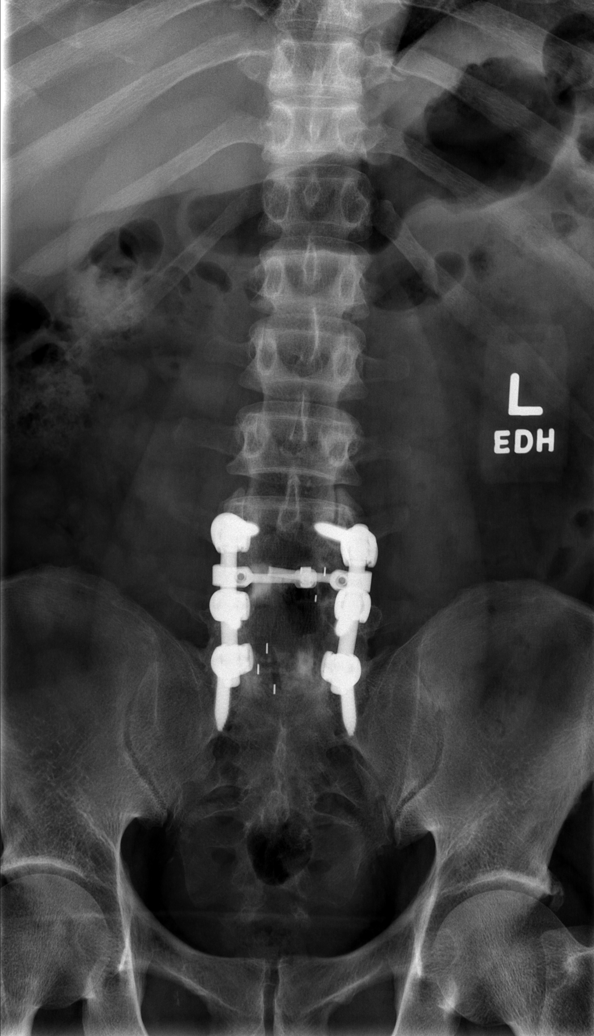

[t l-spine oblique exposure (1 of 2)]
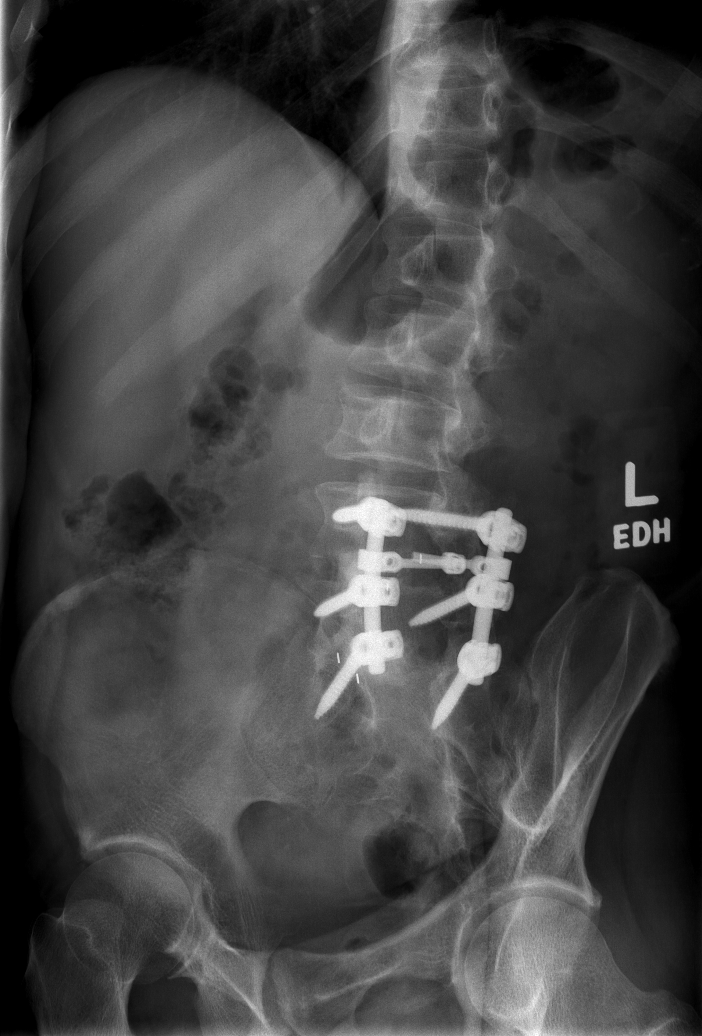

[t l-spine oblique exposure (2 of 2)]
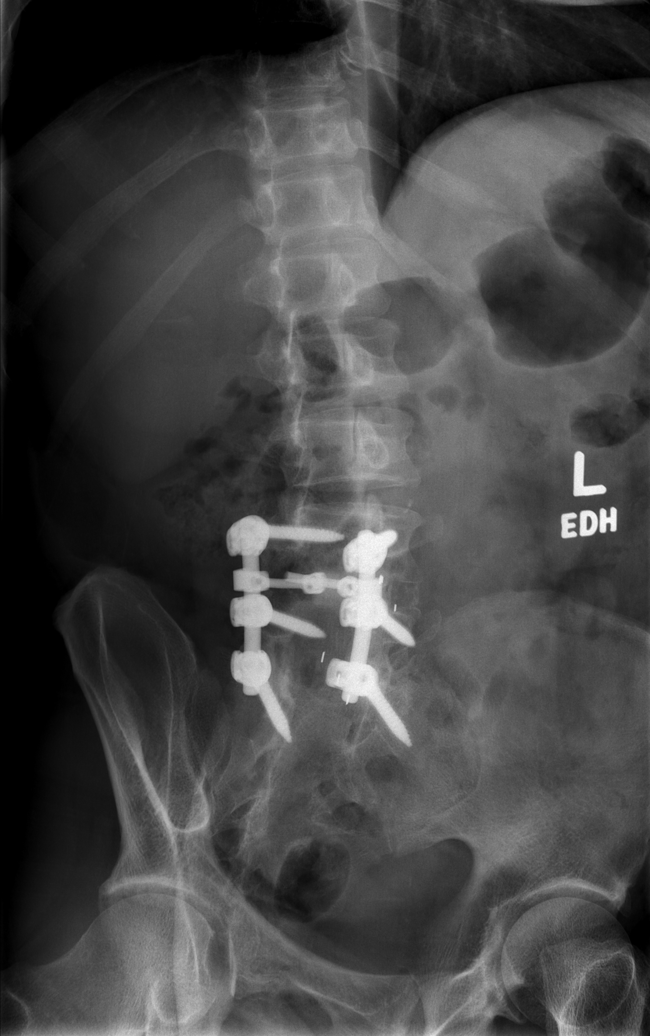

[t l-spine lat]
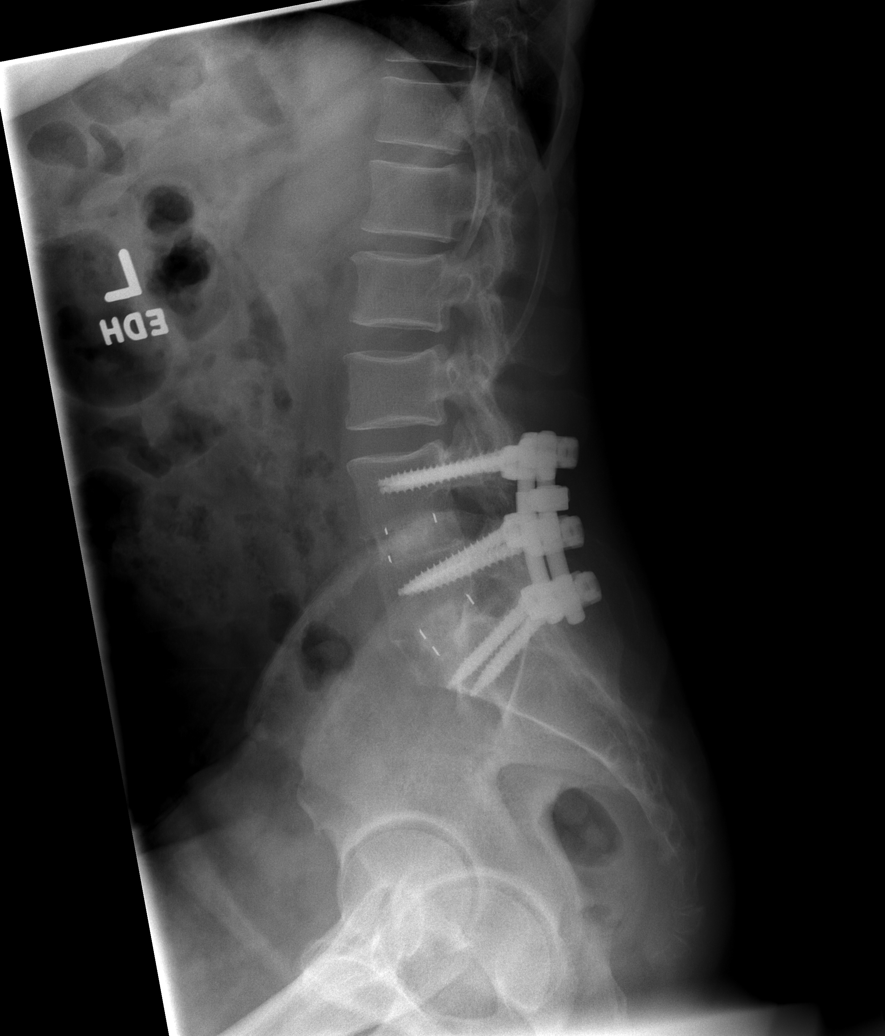

[t l-spine l5-s1 spot]
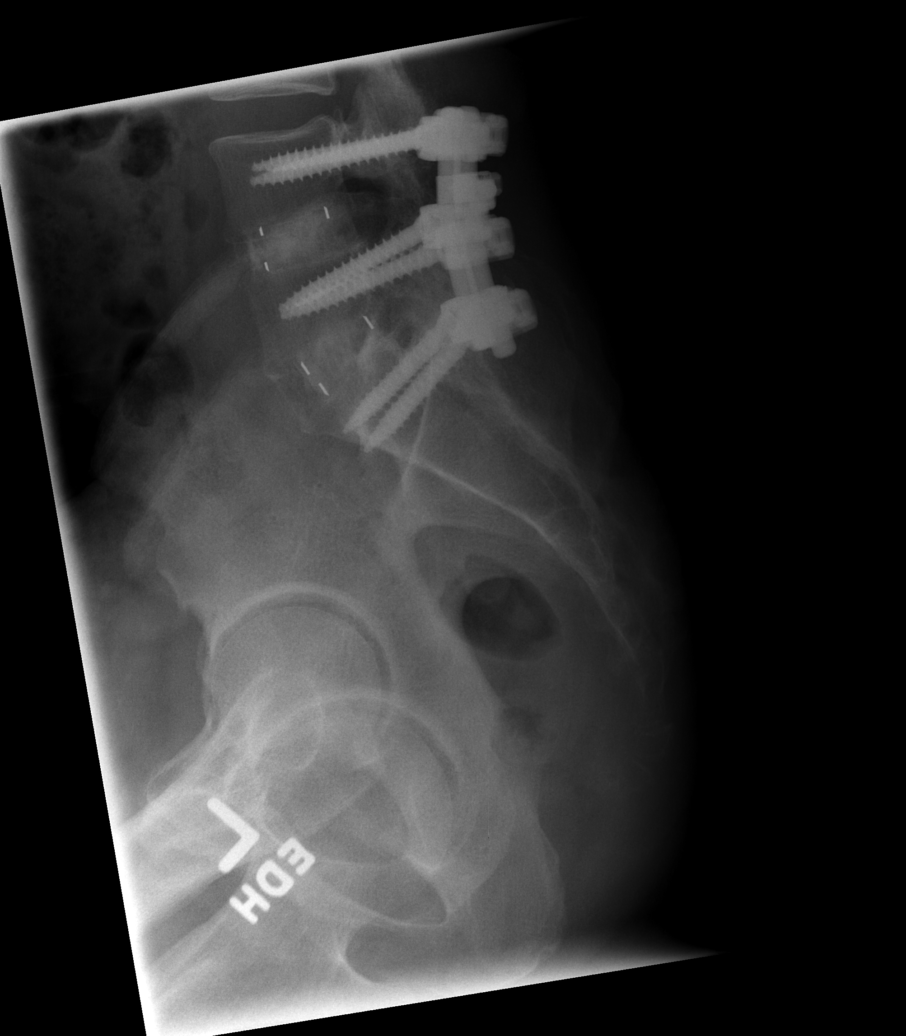

[5 of 5 positions shown; findings below may reference images not displayed]

FINDINGS: Frontal, bilateral oblique, lateral views of the lumbar spine are
obtained. There are 5 non-rib-bearing lumbar type vertebral bodies
in anatomic alignment. Postsurgical changes are seen from
laminectomy, discectomy, and posterior fusion spanning L4 through
S1. No acute displaced fractures. Sacroiliac joints are normal.
IMPRESSION: 1. Stable postsurgical changes.  No acute fracture.

## 2021-12-31 ENCOUNTER — Other Ambulatory Visit (INDEPENDENT_AMBULATORY_CARE_PROVIDER_SITE_OTHER): Payer: Medicaid Other

## 2021-12-31 ENCOUNTER — Other Ambulatory Visit: Payer: Self-pay

## 2021-12-31 DIAGNOSIS — E119 Type 2 diabetes mellitus without complications: Secondary | ICD-10-CM | POA: Diagnosis not present

## 2021-12-31 DIAGNOSIS — E782 Mixed hyperlipidemia: Secondary | ICD-10-CM

## 2022-01-01 LAB — LIPID PANEL W/O CHOL/HDL RATIO
Cholesterol, Total: 100 mg/dL (ref 100–199)
HDL: 38 mg/dL — ABNORMAL LOW (ref >39)
LDL Chol Calc (NIH): 48 mg/dL (ref 0–99)
Triglycerides: 61 mg/dL (ref 0–149)
VLDL Cholesterol Cal: 14 mg/dL (ref 5–40)

## 2022-01-01 LAB — HEMOGLOBIN A1C
Est. average glucose Bld gHb Est-mCnc: 140 mg/dL
Hgb A1c MFr Bld: 6.5 % — ABNORMAL HIGH (ref 4.8–5.6)

## 2022-01-13 ENCOUNTER — Encounter: Payer: Self-pay | Admitting: Internal Medicine

## 2022-01-13 ENCOUNTER — Other Ambulatory Visit: Payer: Self-pay

## 2022-01-13 ENCOUNTER — Ambulatory Visit (INDEPENDENT_AMBULATORY_CARE_PROVIDER_SITE_OTHER): Payer: Medicaid Other | Admitting: Internal Medicine

## 2022-01-13 VITALS — BP 106/72 | HR 64 | Resp 12 | Ht 62.75 in | Wt 148.0 lb

## 2022-01-13 DIAGNOSIS — E119 Type 2 diabetes mellitus without complications: Secondary | ICD-10-CM

## 2022-01-13 DIAGNOSIS — Z23 Encounter for immunization: Secondary | ICD-10-CM

## 2022-01-13 DIAGNOSIS — Z1211 Encounter for screening for malignant neoplasm of colon: Secondary | ICD-10-CM | POA: Diagnosis not present

## 2022-01-13 DIAGNOSIS — Z0000 Encounter for general adult medical examination without abnormal findings: Secondary | ICD-10-CM | POA: Diagnosis not present

## 2022-01-13 DIAGNOSIS — H9190 Unspecified hearing loss, unspecified ear: Secondary | ICD-10-CM

## 2022-01-13 DIAGNOSIS — E782 Mixed hyperlipidemia: Secondary | ICD-10-CM

## 2022-01-13 MED ORDER — FISH OIL BURP-LESS 1000 MG PO CAPS: ORAL_CAPSULE | ORAL | Status: AC

## 2022-01-13 NOTE — Progress Notes (Signed)
Subjective:    Patient ID: Parker Harvey, male   DOB: [birth year], 61 y.o.   MRN: [id number]   HPI  Here for Male CPE:  1.  STE:  Performs.  No findings.  No family history of testicular cancer.  2.  PSA: Last PSA checked 07/30/21 with result of 0.7, normal.  No family history of prostate cancer.    3.  Guaiac Cards/FIT:  Last checked in 2020 and negative for blood.    4.  Colonoscopy:  Performed in 2010 with Dr. Collene Mares and normal.    5.  Cholesterol/Glucose:  HDL remains low, but rest of lipid panel well below goal.  A1C down to 6.5% from 7.4% in August.  States he is walking a lot.    6.  Immunizations:  Has not had bivalent COVID booster.  Refuses Shingles vaccination. Immunization History  Administered Date(s) Administered   Influenza,inj,Quad PF,6+ Mos 08/18/2013   Influenza-Unspecified 11/23/2015, 09/30/2018, 09/15/2019, 09/14/2021   Janssen (J&J) SARS-COV-2 Vaccination 03/21/2020   PFIZER Comirnaty(Gray Top)Covid-19 Tri-Sucrose Vaccine 08/05/2021   Pneumococcal Polysaccharide-23 06/10/2019   Td 12/13/2019   Tdap 12/15/2008     Current Meds  Medication Sig   Accu-Chek Softclix Lancets lancets Check blood sugar twice daily before meals (Patient taking differently: Check blood sugar twice daily before meals. occasionally)   AGAMATRIX ULTRA-THIN LANCETS MISC Check blood glucose twice daily before meals as needed   atorvastatin (LIPITOR) 80 MG tablet TAKE 1/2 TABLET BY MOUTH EVERY EVENING   Blood Glucose Monitoring Suppl (ACCU-CHEK AVIVA PLUS) w/Device KIT Check blood sugar twice daily before meals   cetirizine (ZYRTEC) 10 MG tablet TAKE 1 TABLET BY MOUTH EVERY DAY   Cholecalciferol (VITAMIN D3) 25 MCG (1000 UT) CAPS Take by mouth. 1 daily   cyclobenzaprine (FLEXERIL) 10 MG tablet TAKE 1/2 TO 1 TABLET BY MOUTH EVERY 8 HOURS FOR MUSCLE/BACK PAIN   gabapentin (NEURONTIN) 300 MG capsule TAKE 1 CAPSULE BY MOUTH TWICE DAILY   glucose blood (ACCU-CHEK AVIVA PLUS) test strip  Check blood sugar twice daily before meals   metFORMIN (GLUCOPHAGE-XR) 500 MG 24 hr tablet 1 tab by mouth twice daily with meals   Allergies  Allergen Reactions   Meloxicam Shortness Of Breath    Not clear if true allergy--started out as shortness of breath only when bending over    Naproxen Shortness Of Breath   Past Medical History:  Diagnosis Date   Chronic low back pain 2010   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  Previously followed by Dr. Brien Few for chronic pain.  Epidural injections help, but pt. concernd with having too many and they hurt.   Dental decay    Diabetes type 2, controlled ([place])    Hyperlipidemia 2017   Presbyopia     Past Surgical History:  Procedure Laterality Date   BUNIONECTOMY Left 2005   [place]   low back surgery  2012   Dr. Saintclair Halsted:  L4-5 and L5-S1 decompression and stabilization surgery   Family History  Problem Relation Age of Onset   Heart disease Father 34       MI-cause of death   Hypertension Father    Stroke Father    Hypertension Mother    Diabetes Mother    Heart disease Mother        MI cause of death   Cancer Sister        Breast   Cancer Sister  Breast   Hypertension Brother    Hypertension Brother    Heart disease Brother        2 MIs and CABG   Social History   Socioeconomic History   Marital status: Married    Spouse name: Berlyn Malina   Number of children: 3   Years of education: 10   Highest education level: Not on file  Occupational History   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  Bored and dips. Limited by back.  States can stop on own.  Vaping Use   Vaping Use: Never used  Substance and Sexual  Activity   Alcohol use: Not Currently    Alcohol/week: 0.0 standard drinks    Comment: stopped in 2020.   Drug use: No   Sexual activity: Yes    Comment: wife post menopausal  Other Topics Concern   Not on file  Social History Narrative   Lives off [place] now with wife.    Has 3 children he considers his own, but are biologically his wife's and her previous partner's   Unable to work due to chronic back issues.   Social Determinants of Health   Financial Resource Strain: Low Risk    Difficulty of Paying Living Expenses: Not hard at all  Food Insecurity: No Food Insecurity   Worried About Charity fundraiser in the Last Year: Never true   [place] in the Last Year: Never true  Transportation Needs: No Transportation Needs   Lack of Transportation (Medical): No   Lack of Transportation (Non-Medical): No  Physical Activity: Not on file  Stress: Not on file  Social Connections: Not on file  Intimate Partner Violence: Not At Risk   Fear of Current or Ex-Partner: No   Emotionally Abused: No   Physically Abused: No   Sexually Abused: No     Review of Systems  HENT:  Positive for hearing loss (Is supposed to get hearing aides with [facility]). Negative for dental problem (Did have dental exam this year.  No problems).   Eyes:  Positive for visual disturbance (Has not had diabetic eye exam.  Vision not great.).  Respiratory:  Negative for shortness of breath.   Cardiovascular:  Negative for chest pain, palpitations and leg swelling.  Gastrointestinal:  Negative for abdominal pain and blood in stool (No melena).  Genitourinary:  Negative for decreased urine volume.  Musculoskeletal:  Positive for arthralgias (Wrists and hands).  Skin:  Negative for rash.  Neurological:  Negative for weakness and numbness.     Objective:   BP 106/72 (BP Location: Left Arm, Patient Position: Sitting, Cuff Size: Normal)    Pulse 64    Resp 12    Ht 5' 2.75" (1.594 m)    Wt 148  lb (67.1 kg)    BMI 26.43 kg/m   Physical Exam Constitutional:      Appearance: He is normal weight.  HENT:     Head: Normocephalic and atraumatic.     Right Ear: Tympanic membrane, ear canal and external ear normal.     Left Ear: Tympanic membrane, ear canal and external ear normal.  Nose: Nose normal.     Mouth/Throat:     Mouth: Mucous membranes are moist.     Pharynx: Oropharynx is clear.     Comments: Multiple missing teeth Eyes:     Extraocular Movements: Extraocular movements intact.     Conjunctiva/sclera: Conjunctivae normal.     Pupils: Pupils are equal, round, and reactive to light.     Funduscopic exam:    Right eye: Red reflex present.        Left eye: Red reflex present.    Comments: Discs sharp  Neck:     Thyroid: No thyroid mass or thyromegaly.  Cardiovascular:     Rate and Rhythm: Normal rate and regular rhythm.     Pulses:          Dorsalis pedis pulses are 2+ on the right side and 2+ on the left side.       Posterior tibial pulses are 2+ on the right side and 2+ on the left side.     Heart sounds: S1 normal and S2 normal. No murmur heard.   No friction rub. No S3 or S4 sounds.     Comments: No carotid bruits.  Carotid, radial, femoral, DP and PT pulses normal and equal.    Pulmonary:     Effort: Pulmonary effort is normal.     Breath sounds: Normal breath sounds.  Abdominal:     General: Abdomen is flat. Bowel sounds are normal.     Palpations: Abdomen is soft. There is no hepatomegaly, splenomegaly or mass.     Tenderness: There is no abdominal tenderness.     Hernia: No hernia is present. There is no hernia in the left inguinal area or right inguinal area.  Genitourinary:    Penis: Normal and circumcised.      Testes:        Right: Mass or tenderness not present. Right testis is descended.        Left: Mass or tenderness not present. Left testis is descended.  Musculoskeletal:        General: Normal range of motion.     Cervical back: Normal  range of motion and neck supple.     Comments: Right bunion.  Feet:     Right foot:     Protective Sensation: 10 sites tested.  10 sites sensed.     Skin integrity: Dry skin present.     Left foot:     Protective Sensation: 10 sites tested.  10 sites sensed.     Skin integrity: Dry skin present.     Toenail Condition: Left toenails are abnormally thick.  Lymphadenopathy:     Head:     Right side of head: No submental or submandibular adenopathy.     Left side of head: No submental or submandibular adenopathy.     Cervical: No cervical adenopathy.     Upper Body:     Right upper body: No supraclavicular or axillary adenopathy.     Left upper body: No supraclavicular or axillary adenopathy.  Skin:    General: Skin is warm.     Capillary Refill: Capillary refill takes less than 2 seconds.  Neurological:     General: No focal deficit present.     Mental Status: He is alert and oriented to person, place, and time.     Cranial Nerves: Cranial nerves 2-12 are intact.     Sensory: Sensation is intact.     Motor: Motor function is intact.  Coordination: Coordination is intact.     Gait: Gait is intact.     Deep Tendon Reflexes: Reflexes are normal and symmetric.  Psychiatric:        Attention and Perception: Attention normal.        Mood and Affect: Mood normal.        Speech: Speech normal.        Behavior: Behavior normal. Behavior is cooperative.     Assessment & Plan    CPE Schedule screening colonoscopy PSA recently normal Moderna bivalent vaccine He will contemplate Shingrix.  Not interested today.  2.  DM:  controlled nicely.  Referral to Dr. Katy Fitch for eye exam  3.  Hyperlipidemia:  controlled save for HDL with Statin.  To start burpless fish oil 2000 mg twice daily.  Recheck FLP in 6 months.  4.  Decreased hearing:  to get back to [facility] to get set with hearing aides--lots of difficulties hearing me today.

## 2022-04-16 ENCOUNTER — Ambulatory Visit (INDEPENDENT_AMBULATORY_CARE_PROVIDER_SITE_OTHER): Payer: Medicaid Other | Admitting: Internal Medicine

## 2022-04-16 DIAGNOSIS — Z23 Encounter for immunization: Secondary | ICD-10-CM | POA: Diagnosis not present

## 2022-05-14 ENCOUNTER — Other Ambulatory Visit: Payer: Self-pay | Admitting: Internal Medicine

## 2022-07-01 ENCOUNTER — Telehealth: Payer: Self-pay

## 2022-07-01 MED ORDER — METHOCARBAMOL 750 MG PO TABS: 750.0000 mg | ORAL_TABLET | Freq: Four times a day (QID) | ORAL | 0 refills | Status: DC

## 2022-07-01 NOTE — Telephone Encounter (Signed)
Patient called asking for some pain medication after pulling a muscle while caring for his wife who is currently on bedrest. Patient was previously given cyclobenzaprine, but has noticed that he may be hallucinating when taking that rx. Hallucination only occurs after taking cyclobenzaprine. Would like a different medication for his back pain.

## 2022-07-06 ENCOUNTER — Other Ambulatory Visit: Payer: Self-pay | Admitting: Internal Medicine

## 2022-07-07 ENCOUNTER — Other Ambulatory Visit (INDEPENDENT_AMBULATORY_CARE_PROVIDER_SITE_OTHER): Payer: Medicaid Other

## 2022-07-07 DIAGNOSIS — E782 Mixed hyperlipidemia: Secondary | ICD-10-CM

## 2022-07-07 DIAGNOSIS — E119 Type 2 diabetes mellitus without complications: Secondary | ICD-10-CM

## 2022-07-07 DIAGNOSIS — Z79899 Other long term (current) drug therapy: Secondary | ICD-10-CM | POA: Diagnosis not present

## 2022-07-08 LAB — HEMOGLOBIN A1C
Est. average glucose Bld gHb Est-mCnc: 137 mg/dL
Hgb A1c MFr Bld: 6.4 % — ABNORMAL HIGH (ref 4.8–5.6)

## 2022-07-08 LAB — COMPREHENSIVE METABOLIC PANEL
ALT: 29 IU/L (ref 0–44)
AST: 27 IU/L (ref 0–40)
Albumin/Globulin Ratio: 1.7 (ref 1.2–2.2)
Albumin: 4.6 g/dL (ref 3.8–4.9)
Alkaline Phosphatase: 81 IU/L (ref 44–121)
BUN/Creatinine Ratio: 8 — ABNORMAL LOW (ref 9–20)
BUN: 10 mg/dL (ref 6–24)
Bilirubin Total: 0.5 mg/dL (ref 0.0–1.2)
CO2: 22 mmol/L (ref 20–29)
Calcium: 10 mg/dL (ref 8.7–10.2)
Chloride: 107 mmol/L — ABNORMAL HIGH (ref 96–106)
Creatinine, Ser: 1.33 mg/dL — ABNORMAL HIGH (ref 0.76–1.27)
Globulin, Total: 2.7 g/dL (ref 1.5–4.5)
Glucose: 110 mg/dL — ABNORMAL HIGH (ref 70–99)
Potassium: 3.9 mmol/L (ref 3.5–5.2)
Sodium: 134 mmol/L (ref 134–144)
Total Protein: 7.3 g/dL (ref 6.0–8.5)
eGFR: 62 mL/min/{1.73_m2} (ref >59)

## 2022-07-08 LAB — CBC WITH DIFFERENTIAL/PLATELET
Basophils Absolute: 0 10*3/uL (ref 0.0–0.2)
Basos: 1 %
EOS (ABSOLUTE): 0.1 10*3/uL (ref 0.0–0.4)
Eos: 2 %
Hematocrit: 40.7 % (ref 37.5–51.0)
Hemoglobin: 13 g/dL (ref 13.0–17.7)
Immature Grans (Abs): 0 10*3/uL (ref 0.0–0.1)
Immature Granulocytes: 0 %
Lymphocytes Absolute: 2.6 10*3/uL (ref 0.7–3.1)
Lymphs: 42 %
MCH: 28.6 pg (ref 26.6–33.0)
MCHC: 31.9 g/dL (ref 31.5–35.7)
MCV: 90 fL (ref 79–97)
Monocytes Absolute: 0.7 10*3/uL (ref 0.1–0.9)
Monocytes: 11 %
Neutrophils Absolute: 2.8 10*3/uL (ref 1.4–7.0)
Neutrophils: 44 %
Platelets: 257 10*3/uL (ref 150–450)
RBC: 4.54 x10E6/uL (ref 4.14–5.80)
RDW: 13.2 % (ref 11.6–15.4)
WBC: 6.3 10*3/uL (ref 3.4–10.8)

## 2022-07-08 LAB — LIPID PANEL W/O CHOL/HDL RATIO
Cholesterol, Total: 113 mg/dL (ref 100–199)
HDL: 44 mg/dL (ref >39)
LDL Chol Calc (NIH): 53 mg/dL (ref 0–99)
Triglycerides: 80 mg/dL (ref 0–149)
VLDL Cholesterol Cal: 16 mg/dL (ref 5–40)

## 2022-07-08 LAB — MICROALBUMIN / CREATININE URINE RATIO
Creatinine, Urine: 100.1 mg/dL
Microalb/Creat Ratio: <3 mg/g creat (ref 0–29)
Microalbumin, Urine: <3 ug/mL

## 2022-07-10 ENCOUNTER — Other Ambulatory Visit: Payer: Self-pay

## 2022-07-14 ENCOUNTER — Ambulatory Visit (INDEPENDENT_AMBULATORY_CARE_PROVIDER_SITE_OTHER): Payer: Medicaid Other | Admitting: Internal Medicine

## 2022-07-14 ENCOUNTER — Encounter: Payer: Self-pay | Admitting: Internal Medicine

## 2022-07-14 VITALS — BP 108/78 | HR 76 | Resp 20 | Ht 63.0 in | Wt 153.0 lb

## 2022-07-14 DIAGNOSIS — R1032 Left lower quadrant pain: Secondary | ICD-10-CM

## 2022-07-14 DIAGNOSIS — E119 Type 2 diabetes mellitus without complications: Secondary | ICD-10-CM | POA: Diagnosis not present

## 2022-07-14 DIAGNOSIS — M549 Dorsalgia, unspecified: Secondary | ICD-10-CM | POA: Diagnosis not present

## 2022-07-14 DIAGNOSIS — M545 Low back pain, unspecified: Secondary | ICD-10-CM

## 2022-07-14 DIAGNOSIS — Z23 Encounter for immunization: Secondary | ICD-10-CM | POA: Diagnosis not present

## 2022-07-14 DIAGNOSIS — G8929 Other chronic pain: Secondary | ICD-10-CM

## 2022-07-14 DIAGNOSIS — E782 Mixed hyperlipidemia: Secondary | ICD-10-CM

## 2022-07-14 MED ORDER — METHOCARBAMOL 750 MG PO TABS: ORAL_TABLET | ORAL | 1 refills | Status: DC

## 2022-07-14 NOTE — Progress Notes (Signed)
Subjective:    Patient ID: Parker Harvey, male   DOB: 11-Sep-1961, 61 y.o.   MRN: [id number]   HPI   Hyperlipidemia/Dyslipidemia:  HDL up to 44 with Fish oil, but he stopped taking 2000 mg twice daily completely about 2 weeks before coming in for labs.  He did not realize he was to continue after finishing the first bottle.    LDL at goal at 53 as well.  Trigs 80. Lipid Panel     Component Value Date/Time   CHOL 113 07/07/2022 0906   TRIG 80 07/07/2022 0906   HDL 44 07/07/2022 0906   CHOLHDL 2.6 08/18/2013 1008   VLDL 71 (H) 08/18/2013 1008   LDLCALC 53 07/07/2022 0906   LABVLDL 16 07/07/2022 0906   2.  DM:  A1C down to 6.4% on Metformin.  Has not had his eyes checked this year.    3.  CKD:  stable crea at 1.33.  4.  Left sided back pain:  really giving him trouble.  He felt he pulled a muscle caring for his wife after she had hip surgery.  Was having hallucinations with cyclobenzaprine, so asked for something different.  Original injury years ago when lifting table to buff the floor underneath.  2010.  Decompression surgery at L4-S1 performed 2012 with Dr. Wynetta Emery.  He had not had PT in recent years. States the methocarbamol is not really helping.  Taking 750 mg every 6 hours as needed.  5. HM:  unable to get colonoscopy with Dr. Loreta Ave as apparently with outstanding bill from when he did not have coverage, which he cannot afford.  Has not had colonoscopy since 2010  Current Meds  Medication Sig   atorvastatin (LIPITOR) 80 MG tablet TAKE 1/2 TABLET BY MOUTH EVERY EVENING   cetirizine (ZYRTEC) 10 MG tablet TAKE 1 TABLET BY MOUTH EVERY DAY   Cholecalciferol (VITAMIN D3) 25 MCG (1000 UT) CAPS Take by mouth. 1 daily   gabapentin (NEURONTIN) 300 MG capsule TAKE 1 CAPSULE BY MOUTH TWICE DAILY   metFORMIN (GLUCOPHAGE-XR) 500 MG 24 hr tablet 1 tab by mouth twice daily with meals   methocarbamol (ROBAXIN) 750 MG tablet Take 1 tablet (750 mg total) by mouth 4 (four) times daily.   Omega-3  Fatty Acids (FISH OIL BURP-LESS) 1000 MG CAPS 2 caps by mouth twice daily.   Allergies  Allergen Reactions   Meloxicam Shortness Of Breath    Not clear if true allergy--started out as shortness of breath only when bending over    Naproxen Shortness Of Breath     Review of Systems    Objective:   BP 108/78 (BP Location: Left Arm, Patient Position: Sitting, Cuff Size: Normal)   Pulse 76   Resp 20   Ht 5\' 3"  (1.6 m)   Wt 153 lb (69.4 kg)   BMI 27.10 kg/m   Physical Exam NAD HEENT:  PERRL, EOMI. Neck:  Supple, No adenopathy Chest:  CTA CV:  RRR with normal S1 and S2, No S3, S4 or murmur.  Carotid, radial, DP and PT pulses normal and equal Abd:  S, NT, No HSM or mass, + BS MS:  NT down spinous process, entire spine.  Tender to palpation of left back musculature lateral to spine to trap on left.  Most prominent at scapula level. Diabetic Foot Exam - Simple   Simple Foot Form  07/14/2022  8:45 AM  Visual Inspection No deformities, no ulcerations, no other skin breakdown bilaterally: Yes Sensation  Testing Intact to touch and monofilament testing bilaterally: Yes Pulse Check Posterior Tibialis and Dorsalis pulse intact bilaterally: Yes Comments      Assessment & Plan    Dyslipidemia:  improved with fish oil.  Encouraged him to get started back on the capsules at 2000 mg twice daily and stay on them.  Continue statin.  Repeat before CPE in 6 months.  2.  DM:  well controlled.  CPM    3.  Acute on chronic back pain:  discussed he could take the Methocarbamol 1-2 tabs as needed every 6 hours if 1 does not work well, to take 2.   New Rx sent to cover the increased dosing. Also, with the left groin pain, will check hip to be certain not more of a problem with his left LE there.  4.  HM:  Shingrix #2/2

## 2022-07-14 NOTE — Patient Instructions (Signed)
[facility] [address] [place], [address] [place], [place]  [address]  [phone]  For your hip--you do not need an appt--just go in.

## 2022-07-15 ENCOUNTER — Ambulatory Visit
Admission: RE | Admit: 2022-07-15 | Discharge: 2022-07-15 | Disposition: A | Discharge status: Home / Self Care | Payer: Medicaid Other | Source: Ambulatory Visit | Attending: Internal Medicine | Admitting: Internal Medicine

## 2022-07-15 DIAGNOSIS — R1032 Left lower quadrant pain: Secondary | ICD-10-CM

## 2022-07-16 ENCOUNTER — Telehealth: Payer: Self-pay

## 2022-07-16 NOTE — Addendum Note (Signed)
Addended by: Marcene Duos on: 07/16/2022 08:50 AM   Modules accepted: Orders

## 2022-07-16 NOTE — Telephone Encounter (Signed)
Dr Eliberto Ivory office called to let us know that he should be seen by Dr Wynetta Emery about his lower back pain.

## 2022-07-30 ENCOUNTER — Telehealth: Payer: Self-pay

## 2022-07-30 NOTE — Telephone Encounter (Signed)
Patient called to report that methocarbamol was giving his side effects. When taking the medication patient would have tremors, feel paranoid, and have hallucinations. Last time patient took the medication was on Friday 07/25/22 and has not had any symptoms since then. Patient still needs a medication for his back pain.

## 2022-08-06 ENCOUNTER — Encounter: Payer: Self-pay | Admitting: Orthopaedic Surgery

## 2022-08-06 ENCOUNTER — Ambulatory Visit (INDEPENDENT_AMBULATORY_CARE_PROVIDER_SITE_OTHER): Payer: Medicaid Other | Admitting: Orthopaedic Surgery

## 2022-08-06 DIAGNOSIS — M25552 Pain in left hip: Secondary | ICD-10-CM

## 2022-08-06 MED ORDER — TIZANIDINE HCL 4 MG PO TABS: 4.0000 mg | ORAL_TABLET | Freq: Three times a day (TID) | ORAL | 1 refills | Status: AC | PRN

## 2022-08-06 NOTE — Progress Notes (Signed)
The patient is referred to me to evaluate and treat left hip pain.  He comes in with his daughter today and he does ambulate cane.  He has had extensive spine surgery in the past in 2012.  He points to the low back to the left side as a source of his pain and not his hip itself.  He denies any groin pain.  He said no injury.  He is taking gabapentin and on methocarbamol but the methocarbamol was causing him to have hallucinations.  He is diabetic but has a hemoglobin A1c of the low 6 range.  On exam I can easily put his left hip to internal and external rotation with no pain in the groin.  When I have him lay on the side there is no pain of the trochanteric area either.  He does have irritation of the lumbar spine with a positive straight leg raise on the left side and his pain seems to be all around the left low back.  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  The pain is only been going on for about a month.  I recommended that he see his spine specialist to see if there is any correlation with the pain he is having with any issues with his low back to the left side.  We will try Zanaflex instead of the methocarbamol.  We can always set him up for an intra-articular injection in his left hip joint but he again does not seem to be favoring his left hip and his exam was essentially normal today of the left hip.  He is ambulate with a cane in his opposite hand and wearing a back support brace.

## 2022-08-08 NOTE — Telephone Encounter (Signed)
Patient received a new rx after going to his appointment with the specialist

## 2022-08-11 ENCOUNTER — Other Ambulatory Visit: Payer: Self-pay | Admitting: Internal Medicine

## 2022-08-19 ENCOUNTER — Telehealth: Payer: Self-pay | Admitting: Orthopaedic Surgery

## 2022-08-19 NOTE — Telephone Encounter (Signed)
error 

## 2022-08-26 ENCOUNTER — Ambulatory Visit: Payer: Medicaid Other | Attending: Internal Medicine

## 2022-08-26 ENCOUNTER — Other Ambulatory Visit: Payer: Self-pay

## 2022-08-26 DIAGNOSIS — M5442 Lumbago with sciatica, left side: Secondary | ICD-10-CM | POA: Insufficient documentation

## 2022-08-26 DIAGNOSIS — G8929 Other chronic pain: Secondary | ICD-10-CM | POA: Diagnosis present

## 2022-08-26 DIAGNOSIS — M6281 Muscle weakness (generalized): Secondary | ICD-10-CM | POA: Insufficient documentation

## 2022-08-26 DIAGNOSIS — R262 Difficulty in walking, not elsewhere classified: Secondary | ICD-10-CM | POA: Diagnosis present

## 2022-08-26 DIAGNOSIS — M549 Dorsalgia, unspecified: Secondary | ICD-10-CM | POA: Diagnosis not present

## 2022-08-26 NOTE — Therapy (Signed)
OUTPATIENT PHYSICAL THERAPY THORACOLUMBAR EVALUATION   Patient Name: Parker Harvey MRN: [id number] DOB:02-Feb-1961, 61 y.o., male Today's Date: 08/27/2022   PT End of Session - 08/27/22 0823     Visit Number 1    Number of Visits 13    Date for PT Re-Evaluation 10/17/22    Authorization Type [place] MEDICAID WELLCARE    PT Start Time 1015    PT Stop Time 1100    PT Time Calculation (min) 45 min    Equipment Utilized During Treatment Other (comment)   [place]   Activity Tolerance Patient tolerated treatment well    Behavior During Therapy WFL for tasks assessed/performed             Past Medical History:  Diagnosis Date   Chronic low back pain 2010   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  Previously followed by Dr. Brien Harvey for chronic pain.  Epidural injections help, but pt. concernd with having too many and they hurt.   Dental decay    Diabetes type 2, controlled ([place])    Hyperlipidemia 2017   Presbyopia    Past Surgical History:  Procedure Laterality Date   BUNIONECTOMY Left 2005   [place]   low back surgery  2012   Dr. Saintclair Harvey:  L4-5 and L5-S1 decompression and stabilization surgery   Patient Active Problem List   Diagnosis Date Noted   Decreased hearing 01/13/2022   Elevated liver enzymes 12/13/2019   Ingrown right greater toenail 12/13/2019   Diabetes type 2, controlled ([place])    Insomnia 01/18/2016   Onychomycosis 01/18/2016   Mixed hyperlipidemia 12/16/2015   Dental decay 12/03/2015   Presbyopia 12/03/2015   Chronic back pain 08/18/2013    PCP: Parker Hook, MD  REFERRING PROVIDER: Mack Hook, MD  REFERRING DIAG: M54.9 (ICD-10-CM) - Acute left-sided back pain, unspecified back location  Rationale for Evaluation and Treatment Rehabilitation  THERAPY DIAG:  Chronic bilateral low back pain with left-sided sciatica  Difficulty in walking, not elsewhere classified  Muscle weakness (generalized)  ONSET  DATE: Chronic  SUBJECTIVE:                                                                                                                                                                                           SUBJECTIVE STATEMENT: Pt reports his low back and L LE has been bothering for about 8 years pt reports a Hx of back surgery.in 2012 and has received steroid injections in the past. He has also received PT at several times. Pt notes swelling on the inside of his back. Pt wears a back brace  during the day, but not when sleeping His back bothers him if he is out of it for 10 or more mins. Pt states he was experiencing L hip pain but it has improved. Pt reports he assists his wife who is awaiting hip surgery and is primarily in bed.  PERTINENT HISTORY:  2012: L4-5 and L5-S1 decompression and stabilization surgery, DM2  PAIN:  Are you having pain? Yes: NPRS scale: 7/10 Pain location: low back and down L LE to knee Pain description: ache Aggravating factors: Prolonged standing and sitting, bending forward, lifting Relieving factors: Walking, medication, back brace Pain range: 5-9/10  PRECAUTIONS: None  WEIGHT BEARING RESTRICTIONS No  FALLS:  Has patient fallen in last 6 months? No  LIVING ENVIRONMENT: Lives with: lives with their family Lives in: House/apartment Able to access home  OCCUPATION: Disability  PLOF: Independent  PATIENT GOALS: To function with less pain. 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  PATIENT SURVEYS:  Modified Oswestry 27/50, 54%   SCREENING FOR RED FLAGS: Bowel or bladder incontinence: No Cauda equina syndrome: No  COGNITION:  Overall cognitive status: Within functional limits for tasks assessed     SENSATION: WFL  MUSCLE  LENGTH: Hamstrings: Right WNLs deg; Left WNLs deg [name] test: Right WNLs deg; Left WNLs deg  POSTURE: rounded shoulders, forward head, and flexed trunk   PALPATION: TTP to the low back paraspinals and lumbar vertebrae  LUMBAR ROM:   Active  AROM  eval  Flexion Mod limited c min LBP  Extension Mod limited c mod LBP  Right lateral flexion Min limited c no LBP  Left lateral flexion Min limited c mod LBP  Right rotation Full c no LBP  Left rotation Min limited c mod LBP   (Blank rows = not tested)  LOWER EXTREMITY ROM:      Bilat hip ROM was WFLs and hip pain was not provoked, but LBP was provoked Active  Right eval Left eval  Hip flexion    Hip extension    Hip abduction    Hip adduction    Hip internal rotation    Hip external rotation    Knee flexion    Knee extension    Ankle dorsiflexion    Ankle plantarflexion    Ankle inversion    Ankle eversion     (Blank rows = not tested)  LOWER EXTREMITY MMT:     Bridging 20 sec MMT Right eval Left eval  Hip flexion 4 4  Hip extension 4 4  Hip abduction 4 4  Hip adduction    Hip internal rotation    Hip external rotation 4 4  Knee flexion 4+ 4+  Knee extension 4+ 4+  Ankle dorsiflexion    Ankle plantarflexion    Ankle inversion    Ankle eversion     (Blank rows = not tested)  LUMBAR SPECIAL TESTS:  Straight leg raise test: Negative and Slump test: Negative  FUNCTIONAL TESTS:  5 times sit to stand: TBA 2 minute walk test: TBA  GAIT: Distance walked: 281ft Assistive device utilized: Single point cane Level of assistance: Modified independence Comments: Walks c SPC most of the time. Within the clinic for a short distance, gait pattern did not change c the SPC vs. Not c the [facility]. Gait pattern was WNL     TODAY'S  TREATMENT  [facility] Adult PT Treatment:                                                DATE: 08/26/22 Therapeutic Exercise: Supine Bridge 5 reps 10 hold Supine Posterior Pelvic Tilt 10 reps 3  hold Hooklying Clamshell with Resistance 2 x 10 reps 3 hold  PATIENT EDUCATION:  Education details:Eval findings, POC, HEP, self care  Person educated: Patient Education method: Explanation, Demonstration, Tactile cues, Verbal cues, and Handouts Education comprehension: verbalized understanding, returned demonstration, verbal cues required, and tactile cues required   HOME EXERCISE PROGRAM: Access Code: [email or web address] URL: [email or web address] Date: 08/26/2022 Prepared by: Gar Ponto  Exercises - Supine Bridge  - 2 x daily - 7 x weekly - 1 sets - 10 reps - 10 hold - Supine Posterior Pelvic Tilt  - 2 x daily - 7 x weekly - 1 sets - 10 reps - 3 hold - Hooklying Clamshell with Resistance  - 2 x daily - 7 x weekly - 1 sets - 10 reps - 3 hold  ASSESSMENT:  CLINICAL IMPRESSION: Patient is a 61 y.o. male who was seen today for physical therapy evaluation and treatment for Acute left-sided back pain, unspecified back location. Pt reported the most significant increase in LBP with ext, and L side bending, and L rotation. Decreased core and and hip strength was the most significant finding. Trunk ROM has min to mod limitations.  OBJECTIVE IMPAIRMENTS decreased activity tolerance, difficulty walking, decreased ROM, decreased strength, impaired flexibility, obesity, and pain.   ACTIVITY LIMITATIONS carrying, lifting, bending, sitting, standing, squatting, sleeping, stairs, locomotion level, and caring for others  PARTICIPATION LIMITATIONS: meal prep, cleaning, laundry, driving, shopping, community activity, and yard work  [place], Past/current experiences, Time since onset of injury/illness/exacerbation, and 1 comorbidity: DM2  are also affecting patient's functional outcome.   REHAB POTENTIAL: Fair chronicity of issue  CLINICAL DECISION MAKING: Evolving/moderate complexity  EVALUATION COMPLEXITY: Moderate   GOALS:  SHORT TERM GOALS: Target date:  09/17/2022  Pt will be Ind in an initial HEP  Baseline:started Goal status: INITIAL  2.  Pt will voice understanding of measures to assist in pain reduction  Baseline:  Goal status: INITIAL   LONG TERM GOALS: Target date: 10/17/22  Pt will be Ind in a final HEP to maintain achieved LOF  Baseline: started Goal status: INITIAL  2.  Increase bilal knee strength to 5/5 and hip to 4+/5 for improved trunk support and function Baseline: see flow sheets Goal status: INITIAL  3.  Pt will be able to complete a bridge and a plank from his knees for 60" each for improved trunk strength and function Baseline: Bridge = 20" Goal status: INITIAL  4.  Improve 5xSTS by MCID of 5" and 2MWT by MCID of 68ft as indication of improved functional mobility  Baseline: TBA Goal status: INITIAL  5.  Pt will report a decrease in LBP and L LE pain to 3-6/10 with daily activities for improved function and QOL Baseline: 5-9/10 Goal status: INITIAL  PLAN: PT FREQUENCY: 2x/week  PT DURATION: 6 weeks  PLANNED INTERVENTIONS: Therapeutic exercises, Therapeutic activity, Balance training, Gait training, Patient/Family education, Self Care, Joint mobilization, Aquatic Therapy, Dry Needling, Cryotherapy, Moist heat, Taping, Ultrasound, Manual therapy, and Re-evaluation.  PLAN FOR NEXT SESSION: Assess response to HEP; progress therex as indicated; use of  modalities, manual therapy; and TPDN as indicated.   Wanetta Funderburke MS, PT 08/27/22 3:40 PM  Check all possible CPT codes: 54650 - PT Re-evaluation, 97110- Therapeutic Exercise, [phone] - Gait Training, 97140 - Manual Therapy, 97530 - Therapeutic Activities, 97535 - Self Care, [phone] - Mechanical traction, 97014 - Electrical stimulation (unattended), [id number] - Electrical stimulation (Manual), [id number] - Ultrasound, and [id number] - Aquatic therapy     If treatment provided at initial evaluation, no treatment charged due to lack of authorization.

## 2022-09-02 ENCOUNTER — Ambulatory Visit: Payer: Medicaid Other

## 2022-09-02 DIAGNOSIS — M5442 Lumbago with sciatica, left side: Secondary | ICD-10-CM | POA: Diagnosis not present

## 2022-09-02 DIAGNOSIS — M6281 Muscle weakness (generalized): Secondary | ICD-10-CM

## 2022-09-02 DIAGNOSIS — G8929 Other chronic pain: Secondary | ICD-10-CM

## 2022-09-02 DIAGNOSIS — R262 Difficulty in walking, not elsewhere classified: Secondary | ICD-10-CM

## 2022-09-02 NOTE — Therapy (Signed)
OUTPATIENT PHYSICAL THERAPY TREATMENT NOTE   Patient Name: Parker Harvey MRN: [id number] DOB:Dec 30, 1960, 61 y.o., male Today's Date: 09/02/2022  PCP: Mack Hook, MD REFERRING PROVIDER: Mack Hook, MD  END OF SESSION:   PT End of Session - 09/02/22 1017     Visit Number 2    Number of Visits 13    Date for PT Re-Evaluation 10/17/22    Authorization Type [place] MEDICAID WELLCARE-WellCare Approved 10 PT visits from 09/02/2022 - 11/01/2022    PT Start Time 1016    PT Stop Time 1100    PT Time Calculation (min) 44 min    Equipment Utilized During Treatment Other (comment)   [place]   Activity Tolerance Patient tolerated treatment well    Behavior During Therapy [facility] for tasks assessed/performed             Past Medical History:  Diagnosis Date   Chronic low back pain 2010   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  Previously followed by Dr. Brien Few for chronic pain.  Epidural injections help, but pt. concernd with having too many and they hurt.   Dental decay    Diabetes type 2, controlled ([place])    Hyperlipidemia 2017   Presbyopia    Past Surgical History:  Procedure Laterality Date   BUNIONECTOMY Left 2005   [place]   low back surgery  2012   Dr. Saintclair Halsted:  L4-5 and L5-S1 decompression and stabilization surgery   Patient Active Problem List   Diagnosis Date Noted   Decreased hearing 01/13/2022   Elevated liver enzymes 12/13/2019   Ingrown right greater toenail 12/13/2019   Diabetes type 2, controlled ([place])    Insomnia 01/18/2016   Onychomycosis 01/18/2016   Mixed hyperlipidemia 12/16/2015   Dental decay 12/03/2015   Presbyopia 12/03/2015   Chronic back pain 08/18/2013    REFERRING DIAG: M54.9 (ICD-10-CM) - Acute left-sided back pain, unspecified back location  THERAPY DIAG:  Chronic bilateral low back pain with left-sided sciatica  Difficulty in walking, not elsewhere classified  Muscle weakness  (generalized)  Rationale for Evaluation and Treatment Rehabilitation  SUBJECTIVE:                                                                                                                                                                                            SUBJECTIVE STATEMENT: Pt reports he has been completing his HEP every couple of days.   PERTINENT HISTORY:  2012: L4-5 and L5-S1 decompression and stabilization surgery, DM2   PAIN:  Are you having pain? Yes: NPRS scale: 7/10 Pain location: low back and down L  LE to knee Pain description: ache Aggravating factors: Prolonged standing and sitting, bending forward, lifting Relieving factors: Walking, medication, back brace On eval: Pain range: 5-9/10   PRECAUTIONS: None   WEIGHT BEARING RESTRICTIONS No   PATIENT GOALS: To function with less pain. 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   PATIENT SURVEYS:  Modified Oswestry 27/50, 54%    SCREENING FOR RED FLAGS: Bowel or bladder incontinence: No Cauda equina syndrome: No   COGNITION:           Overall cognitive status: Within functional limits for tasks assessed                          SENSATION: WFL   MUSCLE LENGTH: Hamstrings: Right WNLs deg; Left WNLs deg Marcello Moores test: Right WNLs deg; Left WNLs deg   POSTURE: rounded shoulders, forward head, and flexed trunk    PALPATION: TTP to the low back paraspinals and lumbar vertebrae   LUMBAR ROM:    Active  AROM  eval  Flexion Mod limited c min LBP  Extension Mod limited c mod LBP  Right lateral flexion Min limited c no LBP  Left lateral flexion Min limited c mod LBP  Right rotation Full c no LBP  Left rotation Min limited  c mod LBP   (Blank rows = not tested)   LOWER EXTREMITY ROM:                          Bilat hip ROM was WFLs and hip pain was not provoked, but LBP was provoked Active  Right eval Left eval  Hip flexion      Hip extension      Hip abduction      Hip adduction      Hip internal rotation      Hip external rotation      Knee flexion      Knee extension      Ankle dorsiflexion      Ankle plantarflexion      Ankle inversion      Ankle eversion       (Blank rows = not tested)   LOWER EXTREMITY MMT:                         Bridging 20 sec MMT Right eval Left eval  Hip flexion 4 4  Hip extension 4 4  Hip abduction 4 4  Hip adduction      Hip internal rotation      Hip external rotation 4 4  Knee flexion 4+ 4+  Knee extension 4+ 4+  Ankle dorsiflexion      Ankle plantarflexion      Ankle inversion      Ankle eversion       (Blank rows = not tested)   LUMBAR SPECIAL TESTS:  Straight leg raise test: Negative and Slump test: Negative   FUNCTIONAL TESTS:  5 times sit to stand: TBA 09/02/22=15.7" bariatric chair 2 minute walk test: TBA 09/02/22=636 ft   GAIT: Distance walked: 215ft Assistive device utilized: Single point cane Level of assistance: Modified independence Comments: Walks c SPC most of the time. Within the clinic  for a short distance, gait pattern did not change c the SPC vs. Not c the [facility]. Gait pattern was WNL        TODAY'S TREATMENT  [facility] Adult PT Treatment:                                                DATE: 09/02/22 Therapeutic Exercise: Mini crunch x15, verbal cue to minimize neck flexion 90/90 abdominal bracing x5 10" Bridging x10 10sec Hooklying Clamshell BluTB 2 x 10 reps 3" Hip add sets ball squeeze x15 5" SKTC x5 15" LTR x5 5" 5xSTS 2MWT Updated HEP  [facility] Adult PT Treatment:                                                DATE: 08/26/22 Therapeutic Exercise: Supine Bridge 5 reps 10 hold Supine Posterior Pelvic Tilt 10 reps 3  hold Hooklying Clamshell with Resistance 2 x 10 reps 3 hold   PATIENT EDUCATION:  Education details:Eval findings, POC, HEP, self care  Person educated: Patient Education method: Explanation, Demonstration, Tactile cues, Verbal cues, and Handouts Education comprehension: verbalized understanding, returned demonstration, verbal cues required, and tactile cues required     HOME EXERCISE PROGRAM: Access Code: [email or web address] URL: [email or web address] Date: 09/02/2022 Prepared by: Gar Ponto  Exercises - Supine Bridge  - 2 x daily - 7 x weekly - 1 sets - 10 reps - 10 hold - Supine Posterior Pelvic Tilt  - 2 x daily - 7 x weekly - 1 sets - 10 reps - 3 hold - Supine 90/90 Abdominal Bracing  - 1 x daily - 7 x weekly - 1 sets - 5 reps - 10 hold - Hooklying Clamshell with Resistance  - 2 x daily - 7 x weekly - 1 sets - 10 reps - 3 hold - Hooklying Single Knee to Chest  - 1 x daily - 7 x weekly - 1 sets - 3 reps - 20 hold - Supine Lower Trunk Rotation  - 1 x daily - 7 x weekly - 1 sets - 5 reps - 5 hold - Hooklying Hamstring Stretch with Strap  - 1 x daily - 7 x weekly - 3 sets - 3 reps - 20 hold   ASSESSMENT:   CLINICAL IMPRESSION: PT was completed for prescribed therex  for lumbopelvic flexibility and strengthening. Pt tolerated without adverse effect and reported min decrease in LBP. Pt's HEP was updated and he was encouraged to complete daily. Pt developed a L hamstring cramp that resolved c a hamstring stretch. Assessed 5xSTS and 2MWT  With go with the 2MTW at the normative standard.  OBJECTIVE IMPAIRMENTS decreased activity tolerance, difficulty walking, decreased ROM, decreased strength, impaired flexibility, obesity, and pain.    ACTIVITY LIMITATIONS carrying, lifting, bending, sitting, standing, squatting, sleeping, stairs, locomotion level, and caring for others   PARTICIPATION LIMITATIONS: meal prep, cleaning, laundry, driving, shopping, community activity, and yard  work   [place], Past/current experiences, Time since onset of injury/illness/exacerbation, and 1 comorbidity: DM2  are also affecting patient's functional outcome.       GOALS:   SHORT TERM GOALS: Target date: 09/17/2022   Pt will be Ind in an initial HEP  Baseline:started Goal status:  INITIAL   2.  Pt will voice understanding of measures to assist in pain reduction  Baseline:  Goal status: INITIAL     LONG TERM GOALS: Target date: 10/17/22   Pt will be Ind in a final HEP to maintain achieved LOF  Baseline: started Goal status: INITIAL   2.  Increase bilal knee strength to 5/5 and hip to 4+/5 for improved trunk support and function Baseline: see flow sheets Goal status: INITIAL   3.  Pt will be able to complete a bridge and a plank from his knees for 60" each for improved trunk strength and function Baseline: Bridge = 20" Goal status: INITIAL   4.  Improve 5xSTS by MCID of 5" and 2MWT by MCID of 63ft as indication of improved functional mobility  Baseline: 5xSTS 15.7 "; 2MWT 673ft- at normative standard Goal status: INITIAL   5.  Pt will report a decrease in LBP and L LE pain to 3-6/10 with daily activities for improved function and QOL Baseline: 5-9/10 Goal status: INITIAL   PLAN: PT FREQUENCY: 2x/week   PT DURATION: 6 weeks   PLANNED INTERVENTIONS: Therapeutic exercises, Therapeutic activity, Balance training, Gait training, Patient/Family education, Self Care, Joint mobilization, Aquatic Therapy, Dry Needling, Cryotherapy, Moist heat, Taping, Ultrasound, Manual therapy, and Re-evaluation.   PLAN FOR NEXT SESSION: Assess response to HEP; progress therex as indicated; use of modalities, manual therapy; and TPDN as indicated.  Dustin Bumbaugh MS, PT 09/02/22 11:35 AM

## 2022-09-05 ENCOUNTER — Encounter: Payer: Self-pay | Admitting: Physical Therapy

## 2022-09-05 ENCOUNTER — Ambulatory Visit: Payer: Medicaid Other | Admitting: Physical Therapy

## 2022-09-05 DIAGNOSIS — G8929 Other chronic pain: Secondary | ICD-10-CM

## 2022-09-05 DIAGNOSIS — M6281 Muscle weakness (generalized): Secondary | ICD-10-CM

## 2022-09-05 DIAGNOSIS — R262 Difficulty in walking, not elsewhere classified: Secondary | ICD-10-CM

## 2022-09-05 DIAGNOSIS — M5442 Lumbago with sciatica, left side: Secondary | ICD-10-CM | POA: Diagnosis not present

## 2022-09-05 NOTE — Therapy (Signed)
OUTPATIENT PHYSICAL THERAPY TREATMENT NOTE   Patient Name: Parker Harvey MRN: [id number] DOB:1961-08-06, 61 y.o., male Today's Date: 09/05/2022  PCP: Julieanne Manson, MD REFERRING PROVIDER: Julieanne Manson, MD  END OF SESSION:   PT End of Session - 09/05/22 0936     Visit Number 3    Number of Visits 13    Date for PT Re-Evaluation 10/17/22    Authorization Type [place] MEDICAID WELL[employer] Approved 10 PT visits from 09/02/2022 - 11/01/2022    Authorization - Visit Number 2    Authorization - Number of Visits 10    PT Start Time 0932    PT Stop Time 1010    PT Time Calculation (min) 38 min             Past Medical History:  Diagnosis Date   Chronic low back pain 2010   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  Previously followed by Dr. Murray Hodgkins for chronic pain.  Epidural injections help, but pt. concernd with having too many and they hurt.   Dental decay    Diabetes type 2, controlled (HCC)    Hyperlipidemia 2017   Presbyopia    Past Surgical History:  Procedure Laterality Date   BUNIONECTOMY Left 2005   [facility]   low back surgery  2012   Dr. Wynetta Emery:  L4-5 and L5-S1 decompression and stabilization surgery   Patient Active Problem List   Diagnosis Date Noted   Decreased hearing 01/13/2022   Elevated liver enzymes 12/13/2019   Ingrown right greater toenail 12/13/2019   Diabetes type 2, controlled (HCC)    Insomnia 01/18/2016   Onychomycosis 01/18/2016   Mixed hyperlipidemia 12/16/2015   Dental decay 12/03/2015   Presbyopia 12/03/2015   Chronic back pain 08/18/2013    REFERRING DIAG: M54.9 (ICD-10-CM) - Acute left-sided back pain, unspecified back location  THERAPY DIAG:  Chronic bilateral low back pain with left-sided sciatica  Difficulty in walking, not elsewhere classified  Muscle weakness (generalized)  Rationale for Evaluation and Treatment Rehabilitation  SUBJECTIVE:                                                                                                                                                                                             SUBJECTIVE STATEMENT: Pt reports he had some increased pain getting on and off the floor to do his exercises.    PERTINENT HISTORY:  2012: L4-5 and L5-S1 decompression and stabilization surgery, DM2   PAIN:  Are you having pain? Yes: NPRS scale: 6/10 Pain location: low back and down L LE to knee Pain description: ache Aggravating  factors: Prolonged standing and sitting, bending forward, lifting Relieving factors: Walking, medication, back brace On eval: Pain range: 5-9/10   PRECAUTIONS: None   WEIGHT BEARING RESTRICTIONS No   PATIENT GOALS: To function with less pain. 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   PATIENT SURVEYS:  Modified Oswestry 27/50, 54%    SCREENING FOR RED FLAGS: Bowel or bladder incontinence: No Cauda equina syndrome: No   COGNITION:           Overall cognitive status: Within functional limits for tasks assessed                          SENSATION: WFL   MUSCLE LENGTH: Hamstrings: Right WNLs deg; Left WNLs deg [name] test: Right WNLs deg; Left WNLs deg   POSTURE: rounded shoulders, forward head, and flexed trunk    PALPATION: TTP to the low back paraspinals and lumbar vertebrae   LUMBAR ROM:    Active  AROM  eval  Flexion Mod limited c min LBP  Extension Mod limited c mod LBP  Right lateral flexion Min limited c no LBP  Left lateral flexion Min limited c mod LBP  Right rotation Full c no LBP  Left rotation Min limited c mod LBP   (Blank rows = not tested)   LOWER EXTREMITY ROM:                          Bilat  hip ROM was WFLs and hip pain was not provoked, but LBP was provoked Active  Right eval Left eval  Hip flexion      Hip extension      Hip abduction      Hip adduction      Hip internal rotation      Hip external rotation      Knee flexion      Knee extension      Ankle dorsiflexion      Ankle plantarflexion      Ankle inversion      Ankle eversion       (Blank rows = not tested)   LOWER EXTREMITY MMT:                         Bridging 20 sec MMT Right eval Left eval  Hip flexion 4 4  Hip extension 4 4  Hip abduction 4 4  Hip adduction      Hip internal rotation      Hip external rotation 4 4  Knee flexion 4+ 4+  Knee extension 4+ 4+  Ankle dorsiflexion      Ankle plantarflexion      Ankle inversion      Ankle eversion       (Blank rows = not tested)   LUMBAR SPECIAL TESTS:  Straight leg raise test: Negative and Slump test: Negative   FUNCTIONAL TESTS:  5 times sit to stand: TBA 09/02/22=15.7" bariatric chair 2 minute walk test: TBA 09/02/22=636 ft   GAIT: Distance walked: 216ft Assistive device utilized: Single point cane Level of assistance: Modified independence Comments: Walks c SPC most of the time. Within the clinic for a short distance, gait pattern did  not [facility] c [facility]. [facility] Gait pattern was WNL        TODAY'S TREATMENT  [facility] Adult PT Treatment:                                                DATE: 09/05/22 Therapeutic Exercise: Nustep L5 UE/LE x 7 min Hooklying Clamshell BluTB 2 x 15 reps 3" Bridging x10 10sec with blue band at knees Mini crunch x15 Hip add sets ball squeeze x15 3" 90/90 abdominal bracing x5 10"  SKTC x5 20" LTR x5 10" Figure 4 push and pull 10 x STS   [facility] Adult PT Treatment:                                                DATE: 09/02/22 Therapeutic Exercise: Mini crunch x15, verbal cue to minimize neck flexion 90/90 abdominal bracing x5 10" Bridging x10 10sec Hooklying Clamshell BluTB 2 x 10 reps 3" Hip add  sets ball squeeze x15 5" SKTC x5 15" LTR x5 5" 5xSTS 2MWT Updated HEP  [facility] Adult PT Treatment:                                                DATE: 08/26/22 Therapeutic Exercise: Supine Bridge 5 reps 10 hold Supine Posterior Pelvic Tilt 10 reps 3 hold Hooklying Clamshell with Resistance 2 x 10 reps 3 hold   PATIENT EDUCATION:  Education details:Eval findings, POC, HEP, self care  Person educated: Patient Education method: Explanation, Demonstration, Tactile cues, Verbal cues, and Handouts Education comprehension: verbalized understanding, returned demonstration, verbal cues required, and tactile cues required     HOME EXERCISE PROGRAM: Access Code: [email or web address] URL: [email or web address] Date: 09/02/2022 Prepared by: Joellyn RuedAllen Ralls  Exercises - Supine Bridge  - 2 x daily - 7 x weekly - 1 sets - 10 reps - 10 hold - Supine Posterior Pelvic Tilt  - 2 x daily - 7 x weekly - 1 sets - 10 reps - 3 hold - Supine 90/90 Abdominal Bracing  - 1 x daily - 7 x weekly - 1 sets - 5 reps - 10 hold - Hooklying Clamshell with Resistance  - 2 x daily - 7 x weekly - 1 sets - 10 reps - 3 hold - Hooklying Single Knee to Chest  - 1 x daily - 7 x weekly - 1 sets - 3 reps - 20 hold - Supine Lower Trunk Rotation  - 1 x daily - 7 x weekly - 1 sets - 5 reps - 5 hold - Hooklying Hamstring Stretch with Strap  - 1 x daily - 7 x weekly - 3 sets - 3 reps - 20 hold   ASSESSMENT:   CLINICAL IMPRESSION: PT was completed for prescribed therex  for lumbopelvic flexibility and strengthening. Pt tolerated without adverse effect. Progressed hip stretches to figure 4 with overpressure. He reported a strong stretch on the left. With STS reps he reported increased knee pain.   OBJECTIVE IMPAIRMENTS decreased activity tolerance, difficulty walking, decreased ROM, decreased strength, impaired flexibility, obesity, and pain.  ACTIVITY LIMITATIONS carrying, lifting, bending, sitting, standing, squatting, sleeping,  stairs, locomotion level, and caring for others   PARTICIPATION LIMITATIONS: meal prep, cleaning, laundry, driving, shopping, community activity, and yard work   [place], Past/current experiences, Time since onset of injury/illness/exacerbation, and 1 comorbidity: DM2  are also affecting patient's functional outcome.       GOALS:   SHORT TERM GOALS: Target date: 09/17/2022   Pt will be Ind in an initial HEP  Baseline:started Goal status: INITIAL   2.  Pt will voice understanding of measures to assist in pain reduction  Baseline:  Goal status: INITIAL     LONG TERM GOALS: Target date: 10/17/22   Pt will be Ind in a final HEP to maintain achieved LOF  Baseline: started Goal status: INITIAL   2.  Increase bilal knee strength to 5/5 and hip to 4+/5 for improved trunk support and function Baseline: see flow sheets Goal status: INITIAL   3.  Pt will be able to complete a bridge and a plank from his knees for 60" each for improved trunk strength and function Baseline: Bridge = 20" Goal status: INITIAL   4.  Improve 5xSTS by MCID of 5" and 2MWT by MCID of 43ft as indication of improved functional mobility  Baseline: 5xSTS 15.7 "; 2MWT 63ft- at normative standard Goal status: INITIAL   5.  Pt will report a decrease in LBP and L LE pain to 3-6/10 with daily activities for improved function and QOL Baseline: 5-9/10 Goal status: INITIAL   PLAN: PT FREQUENCY: 2x/week   PT DURATION: 6 weeks   PLANNED INTERVENTIONS: Therapeutic exercises, Therapeutic activity, Balance training, Gait training, Patient/Family education, Self Care, Joint mobilization, Aquatic Therapy, Dry Needling, Cryotherapy, Moist heat, Taping, Ultrasound, Manual therapy, and Re-evaluation.   PLAN FOR NEXT SESSION: Assess response to HEP; progress therex as indicated; use of modalities, manual therapy; and TPDN as indicated.  Hessie Diener, PTA 09/05/22 10:08 AM Phone: [phone] Fax:  [phone]

## 2022-09-09 ENCOUNTER — Encounter: Payer: Self-pay | Admitting: Physical Therapy

## 2022-09-09 ENCOUNTER — Ambulatory Visit: Payer: Medicaid Other | Admitting: Physical Therapy

## 2022-09-09 DIAGNOSIS — R262 Difficulty in walking, not elsewhere classified: Secondary | ICD-10-CM

## 2022-09-09 DIAGNOSIS — M6281 Muscle weakness (generalized): Secondary | ICD-10-CM

## 2022-09-09 DIAGNOSIS — G8929 Other chronic pain: Secondary | ICD-10-CM

## 2022-09-09 DIAGNOSIS — M5442 Lumbago with sciatica, left side: Secondary | ICD-10-CM | POA: Diagnosis not present

## 2022-09-09 NOTE — Therapy (Signed)
OUTPATIENT PHYSICAL THERAPY TREATMENT NOTE   Patient Name: Parker Harvey MRN: [id number] DOB:03/26/1961, 61 y.o., male Today's Date: 09/09/2022  PCP: Mack Hook, MD REFERRING PROVIDER: Mack Hook, MD  END OF SESSION:   PT End of Session - 09/09/22 0844     Visit Number 4    Number of Visits 13    Date for PT Re-Evaluation 10/17/22    Authorization Type [place] MEDICAID WELLCARE-WellCare Approved 10 PT visits from 09/02/2022 - 11/01/2022    Authorization - Visit Number 3    Authorization - Number of Visits 10    PT Start Time 0845    PT Stop Time 0930    PT Time Calculation (min) 45 min             Past Medical History:  Diagnosis Date   Chronic low back pain 2010   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  Previously followed by Dr. Brien Few for chronic pain.  Epidural injections help, but pt. concernd with having too many and they hurt.   Dental decay    Diabetes type 2, controlled ([place])    Hyperlipidemia 2017   Presbyopia    Past Surgical History:  Procedure Laterality Date   BUNIONECTOMY Left 2005   [place]   low back surgery  2012   Dr. Saintclair Halsted:  L4-5 and L5-S1 decompression and stabilization surgery   Patient Active Problem List   Diagnosis Date Noted   Decreased hearing 01/13/2022   Elevated liver enzymes 12/13/2019   Ingrown right greater toenail 12/13/2019   Diabetes type 2, controlled ([place])    Insomnia 01/18/2016   Onychomycosis 01/18/2016   Mixed hyperlipidemia 12/16/2015   Dental decay 12/03/2015   Presbyopia 12/03/2015   Chronic back pain 08/18/2013    REFERRING DIAG: M54.9 (ICD-10-CM) - Acute left-sided back pain, unspecified back location  THERAPY DIAG:  Chronic bilateral low back pain with left-sided sciatica  Difficulty in walking, not elsewhere classified  Muscle weakness (generalized)  Rationale for Evaluation and Treatment Rehabilitation  SUBJECTIVE:                                                                                                                                                                                             SUBJECTIVE STATEMENT: Pt reports he had pain this morning but is fine now after taking 2 tylenol. I have not completed the HEP because it is hard for me to do the exercises with my wife who is on bed rest. I have been doing more walking.    PERTINENT HISTORY:  2012: L4-5 and L5-S1 decompression and stabilization  surgery, DM2   PAIN:  Are you having pain? Yes: NPRS scale: 0/10 Pain location: low back and down L LE to knee Pain description: ache Aggravating factors: Prolonged standing and sitting, bending forward, lifting Relieving factors: Walking, medication, back brace On eval: Pain range: 5-9/10   PRECAUTIONS: None   WEIGHT BEARING RESTRICTIONS No   PATIENT GOALS: To function with less pain. 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   PATIENT SURVEYS:  Modified Oswestry 27/50, 54%    SCREENING FOR RED FLAGS: Bowel or bladder incontinence: No Cauda equina syndrome: No   COGNITION:           Overall cognitive status: Within functional limits for tasks assessed                          SENSATION: WFL   MUSCLE LENGTH: Hamstrings: Right WNLs deg; Left WNLs deg Marcello Moores test: Right WNLs deg; Left WNLs deg   POSTURE: rounded shoulders, forward head, and flexed trunk    PALPATION: TTP to the low back paraspinals and lumbar vertebrae   LUMBAR ROM:    Active  AROM  eval  Flexion Mod limited c min LBP  Extension Mod limited c mod LBP  Right lateral flexion Min limited c no LBP  Left lateral flexion Min limited c mod LBP  Right rotation Full c no  LBP  Left rotation Min limited c mod LBP   (Blank rows = not tested)   LOWER EXTREMITY ROM:                          Bilat hip ROM was WFLs and hip pain was not provoked, but LBP was provoked Active  Right eval Left eval  Hip flexion      Hip extension      Hip abduction      Hip adduction      Hip internal rotation      Hip external rotation      Knee flexion      Knee extension      Ankle dorsiflexion      Ankle plantarflexion      Ankle inversion      Ankle eversion       (Blank rows = not tested)   LOWER EXTREMITY MMT:                         Bridging 20 sec MMT Right eval Left eval  Hip flexion 4 4  Hip extension 4 4  Hip abduction 4 4  Hip adduction      Hip internal rotation      Hip external rotation 4 4  Knee flexion 4+ 4+  Knee extension 4+ 4+  Ankle dorsiflexion      Ankle plantarflexion      Ankle inversion      Ankle eversion       (Blank rows = not tested)   LUMBAR SPECIAL TESTS:  Straight leg raise test: Negative and Slump test: Negative   FUNCTIONAL TESTS:  5 times sit to stand:  09/02/22=15.7" bariatric chair 2 minute walk test:  09/02/22=636 ft   GAIT: Distance walked: 233ft Assistive  device utilized: Single point cane Level of assistance: Modified independence Comments: Walks c SPC most of the time. Within the clinic for a short distance, gait pattern did not change c the SPC vs. Not c the [facility]. Gait pattern was WNL        TODAY'S TREATMENT  [facility] Adult PT Treatment:                                                DATE: 09/09/22 Therapeutic Exercise: Nustep L5 UE/LE x 7 min 10 x STS Bridge 10 sec x 10 Hooklying Clamshell BluTB 2 x 15 reps 3" Bridging x10 10sec with blue band at knees 90/90 abdominal bracing x5 10" Hip add sets ball squeeze x15 3" Mini crunch x15  SKTC x5 20" LTR x5 10" Figure 4 push and pull Therapeutic Activity:  Floor transfer x 1  Lunge to kneeling on airex  and back up x 4   [facility] Adult PT Treatment:                                                 DATE: 09/05/22 Therapeutic Exercise: Nustep L5 UE/LE x 7 min Hooklying Clamshell BluTB 2 x 15 reps 3" Bridging x10 10sec with blue band at knees Mini crunch x15 Hip add sets ball squeeze x15 3" 90/90 abdominal bracing x5 10"  SKTC x5 20" LTR x5 10" Figure 4 push and pull 10 x STS   [facility] Adult PT Treatment:                                                DATE: 09/02/22 Therapeutic Exercise: Mini crunch x15, verbal cue to minimize neck flexion 90/90 abdominal bracing x5 10" Bridging x10 10sec Hooklying Clamshell BluTB 2 x 10 reps 3" Hip add sets ball squeeze x15 5" SKTC x5 15" LTR x5 5" 5xSTS 2MWT Updated HEP  [facility] Adult PT Treatment:                                                DATE: 08/26/22 Therapeutic Exercise: Supine Bridge 5 reps 10 hold Supine Posterior Pelvic Tilt 10 reps 3 hold Hooklying Clamshell with Resistance 2 x 10 reps 3 hold   PATIENT EDUCATION:  Education details:Eval findings, POC, HEP, self care  Person educated: Patient Education method: Explanation, Demonstration, Tactile cues, Verbal cues, and Handouts Education comprehension: verbalized understanding, returned demonstration, verbal cues required, and tactile cues required     HOME EXERCISE PROGRAM: Access Code: [email or web address] URL: [email or web address] Date: 09/02/2022 Prepared by: Gar Ponto  Exercises - Supine Bridge  - 2 x daily - 7 x weekly - 1 sets - 10 reps - 10 hold - Supine Posterior Pelvic Tilt  - 2 x daily - 7 x weekly - 1 sets - 10 reps - 3 hold - Supine 90/90 Abdominal Bracing  - 1 x daily - 7 x weekly - 1 sets - 5 reps - 10 hold - Hooklying  Clamshell with Resistance  - 2 x daily - 7 x weekly - 1 sets - 10 reps - 3 hold - Hooklying Single Knee to Chest  - 1 x daily - 7 x weekly - 1 sets - 3 reps - 20 hold - Supine Lower Trunk Rotation  - 1 x daily - 7 x weekly - 1 sets - 5 reps - 5 hold - Hooklying Hamstring Stretch with Strap  - 1 x  daily - 7 x weekly - 3 sets - 3 reps - 20 hold   ASSESSMENT:   CLINICAL IMPRESSION: PT was completed for prescribed therex  for lumbopelvic flexibility and strengthening. Pt tolerated without adverse effect. Pt reports increased pain the next morning after last session and mornings are his most painful time of day. He has not performed HEP due to having increased pain after performing on the floor x 1. Today, instructed pt in floor transfer and lunge to kneeling on front of mat. He did not have difficulty or increased pain with these activities today. He reports that his wife is on bed rest so he does not want to do his exercises in the bed. He will borrow a mat from a friend today and see if this helps. Education provided on the need to perform prescribed HEP as well as staying active with walking and gardening.   OBJECTIVE IMPAIRMENTS decreased activity tolerance, difficulty walking, decreased ROM, decreased strength, impaired flexibility, obesity, and pain.    ACTIVITY LIMITATIONS carrying, lifting, bending, sitting, standing, squatting, sleeping, stairs, locomotion level, and caring for others   PARTICIPATION LIMITATIONS: meal prep, cleaning, laundry, driving, shopping, community activity, and yard work   [place], Past/current experiences, Time since onset of injury/illness/exacerbation, and 1 comorbidity: DM2  are also affecting patient's functional outcome.       GOALS:   SHORT TERM GOALS: Target date: 09/17/2022   Pt will be Ind in an initial HEP  Baseline:started Goal status: ONGOING   2.  Pt will voice understanding of measures to assist in pain reduction  Baseline:  Goal status: ONGOING     LONG TERM GOALS: Target date: 10/17/22   Pt will be Ind in a final HEP to maintain achieved LOF  Baseline: started Goal status: INITIAL   2.  Increase bilal knee strength to 5/5 and hip to 4+/5 for improved trunk support and function Baseline: see flow sheets Goal  status: INITIAL   3.  Pt will be able to complete a bridge and a plank from his knees for 60" each for improved trunk strength and function Baseline: Bridge = 20" Goal status: INITIAL   4.  Improve 5xSTS by MCID of 5" and 2MWT by MCID of 55ft as indication of improved functional mobility  Baseline: 5xSTS 15.7 "; 2MWT 638ft- at normative standard Goal status: INITIAL   5.  Pt will report a decrease in LBP and L LE pain to 3-6/10 with daily activities for improved function and QOL Baseline: 5-9/10 Goal status: INITIAL   PLAN: PT FREQUENCY: 2x/week   PT DURATION: 6 weeks   PLANNED INTERVENTIONS: Therapeutic exercises, Therapeutic activity, Balance training, Gait training, Patient/Family education, Self Care, Joint mobilization, Aquatic Therapy, Dry Needling, Cryotherapy, Moist heat, Taping, Ultrasound, Manual therapy, and Re-evaluation.   PLAN FOR NEXT SESSION: Assess response to HEP; progress therex as indicated; use of modalities, manual therapy; and TPDN as indicated. Did he get an exercise mat or try exercises in bed ?  Hessie Diener, PTA 09/09/22 10:33 AM  Phone: [phone] Fax: [phone]

## 2022-09-11 ENCOUNTER — Ambulatory Visit: Payer: Medicaid Other | Admitting: Physical Therapy

## 2022-09-11 ENCOUNTER — Encounter: Payer: Self-pay | Admitting: Physical Therapy

## 2022-09-11 DIAGNOSIS — M5442 Lumbago with sciatica, left side: Secondary | ICD-10-CM | POA: Diagnosis not present

## 2022-09-11 DIAGNOSIS — R262 Difficulty in walking, not elsewhere classified: Secondary | ICD-10-CM

## 2022-09-11 DIAGNOSIS — G8929 Other chronic pain: Secondary | ICD-10-CM

## 2022-09-11 DIAGNOSIS — M6281 Muscle weakness (generalized): Secondary | ICD-10-CM

## 2022-09-11 NOTE — Therapy (Signed)
OUTPATIENT PHYSICAL THERAPY TREATMENT NOTE   Patient Name: Parker Harvey MRN: [id number] DOB:Oct 25, 1961, 61 y.o., male Today's Date: 09/11/2022  PCP: Mack Hook, MD REFERRING PROVIDER: Mack Hook, MD  END OF SESSION:   PT End of Session - 09/11/22 0843     Visit Number 5    Number of Visits 13    Date for PT Re-Evaluation 10/17/22    Authorization Type [place] MEDICAID WELL[employer] Approved 10 PT visits from 09/02/2022 - 11/01/2022    Authorization - Visit Number 4    Authorization - Number of Visits 10    PT Start Time 2536    PT Stop Time 0924    PT Time Calculation (min) 43 min             Past Medical History:  Diagnosis Date   Chronic low back pain 2010   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  Previously followed by Dr. Brien Few for chronic pain.  Epidural injections help, but pt. concernd with having too many and they hurt.   Dental decay    Diabetes type 2, controlled ([place])    Hyperlipidemia 2017   Presbyopia    Past Surgical History:  Procedure Laterality Date   BUNIONECTOMY Left 2005   [place]   low back surgery  2012   Dr. Saintclair Halsted:  L4-5 and L5-S1 decompression and stabilization surgery   Patient Active Problem List   Diagnosis Date Noted   Decreased hearing 01/13/2022   Elevated liver enzymes 12/13/2019   Ingrown right greater toenail 12/13/2019   Diabetes type 2, controlled ([place])    Insomnia 01/18/2016   Onychomycosis 01/18/2016   Mixed hyperlipidemia 12/16/2015   Dental decay 12/03/2015   Presbyopia 12/03/2015   Chronic back pain 08/18/2013    REFERRING DIAG: M54.9 (ICD-10-CM) - Acute left-sided back pain, unspecified back location  THERAPY DIAG:  Chronic bilateral low back pain with left-sided sciatica  Difficulty in walking, not elsewhere classified  Muscle weakness (generalized)  Rationale for Evaluation and Treatment Rehabilitation  SUBJECTIVE:                                                                                                                                                                                             SUBJECTIVE STATEMENT: Pt reports he had pain this morning (5/10) but is fine now and did not take tylenol. Sometimes back feel weak like I cannot turn over in bed.    PERTINENT HISTORY:  2012: L4-5 and L5-S1 decompression and stabilization surgery, DM2   PAIN:  Are you having pain? Yes: NPRS scale: 0/10 Pain location: low  back and down L LE to knee Pain description: ache Aggravating factors: Prolonged standing and sitting, bending forward, lifting Relieving factors: Walking, medication, back brace On eval: Pain range: 5-9/10   PRECAUTIONS: None   WEIGHT BEARING RESTRICTIONS No   PATIENT GOALS: To function with less pain. 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   PATIENT SURVEYS:  Modified Oswestry 27/50, 54%    SCREENING FOR RED FLAGS: Bowel or bladder incontinence: No Cauda equina syndrome: No   COGNITION:           Overall cognitive status: Within functional limits for tasks assessed                          SENSATION: WFL   MUSCLE LENGTH: Hamstrings: Right WNLs deg; Left WNLs deg Marcello Moores test: Right WNLs deg; Left WNLs deg   POSTURE: rounded shoulders, forward head, and flexed trunk    PALPATION: TTP to the low back paraspinals and lumbar vertebrae   LUMBAR ROM:    Active  AROM  eval  Flexion Mod limited c min LBP  Extension Mod limited c mod LBP  Right lateral flexion Min limited c no LBP  Left lateral flexion Min limited c mod LBP  Right rotation Full c no LBP  Left rotation Min limited c mod LBP   (Blank rows = not tested)    LOWER EXTREMITY ROM:                          Bilat hip ROM was WFLs and hip pain was not provoked, but LBP was provoked Active  Right eval Left eval  Hip flexion      Hip extension      Hip abduction      Hip adduction      Hip internal rotation      Hip external rotation      Knee flexion      Knee extension      Ankle dorsiflexion      Ankle plantarflexion      Ankle inversion      Ankle eversion       (Blank rows = not tested)   LOWER EXTREMITY MMT:                         Bridging 20 sec MMT Right eval Left eval  Hip flexion 4 4  Hip extension 4 4  Hip abduction 4 4  Hip adduction      Hip internal rotation      Hip external rotation 4 4  Knee flexion 4+ 4+  Knee extension 4+ 4+  Ankle dorsiflexion      Ankle plantarflexion      Ankle inversion      Ankle eversion       (Blank rows = not tested)   LUMBAR SPECIAL TESTS:  Straight leg raise test: Negative and Slump test: Negative   FUNCTIONAL TESTS:  5 times sit to stand:  09/02/22=15.7" bariatric chair 2 minute walk test:  09/02/22=636 ft   GAIT: Distance walked: 238f Assistive device utilized: Single point cane Level of assistance: Modified independence Comments: Walks c SPC most of the  time. Within the clinic for a short distance, gait pattern did not change c the SPC vs. Not c the [facility]. Gait pattern was WNL        TODAY'S TREATMENT  [facility] Adult PT Treatment:                                                DATE: 09/11/22 Therapeutic Exercise: Nustep L5 UE/LE x 7 min 10 x STS, then x 10 with 10# x 10  Plank on elbows and knees 20 sec x 2 , then for 60 sec  Bridge 60 sec  Bridge 10 sec x 10 with 10 # weight  Ball squeeze x 10 Bridge with ball squeeze x 10  SKTC x 5 each , 10 sec  Mini crunch 10 x 2 Sidelying  hip abduction x15 each Side Clam x 15  Figure 4 push and pull 90/90 ab bracing  LTR   [facility] Adult PT Treatment:                                                DATE: 09/09/22 Therapeutic  Exercise: Nustep L5 UE/LE x 7 min 10 x STS Bridge 10 sec x 10 Hooklying Clamshell BluTB 2 x 15 reps 3" Bridging x10 10sec with blue band at knees 90/90 abdominal bracing x5 10" Hip add sets ball squeeze x15 3" Mini crunch x15  SKTC x5 20" LTR x5 10" Figure 4 push and pull Therapeutic Activity:  Floor transfer x 1  Lunge to kneeling on airex  and back up x 4   [facility] Adult PT Treatment:                                                DATE: 09/05/22 Therapeutic Exercise: Nustep L5 UE/LE x 7 min Hooklying Clamshell BluTB 2 x 15 reps 3" Bridging x10 10sec with blue band at knees Mini crunch x15 Hip add sets ball squeeze x15 3" 90/90 abdominal bracing x5 10"  SKTC x5 20" LTR x5 10" Figure 4 push and pull 10 x STS   [facility] Adult PT Treatment:                                                DATE: 09/02/22 Therapeutic Exercise: Mini crunch x15, verbal cue to minimize neck flexion 90/90 abdominal bracing x5 10" Bridging x10 10sec Hooklying Clamshell BluTB 2 x 10 reps 3" Hip add sets ball squeeze x15 5" SKTC x5 15" LTR x5 5" 5xSTS 2MWT Updated HEP  [facility] Adult PT Treatment:                                                DATE: 08/26/22 Therapeutic Exercise: Supine Bridge 5 reps 10 hold Supine Posterior Pelvic Tilt 10 reps 3 hold Hooklying Clamshell with Resistance 2 x 10  reps 3 hold   PATIENT EDUCATION:  Education details:Eval findings, POC, HEP, self care  Person educated: Patient Education method: Explanation, Demonstration, Tactile cues, Verbal cues, and Handouts Education comprehension: verbalized understanding, returned demonstration, verbal cues required, and tactile cues required     HOME EXERCISE PROGRAM: Access Code: [email or web address] URL: [email or web address] Date: 09/02/2022 Prepared by: Gar Ponto  Exercises - Supine Bridge  - 2 x daily - 7 x weekly - 1 sets - 10 reps - 10 hold - Supine Posterior Pelvic Tilt  - 2 x daily - 7 x weekly - 1 sets - 10 reps -  3 hold - Supine 90/90 Abdominal Bracing  - 1 x daily - 7 x weekly - 1 sets - 5 reps - 10 hold - Hooklying Clamshell with Resistance  - 2 x daily - 7 x weekly - 1 sets - 10 reps - 3 hold - Hooklying Single Knee to Chest  - 1 x daily - 7 x weekly - 1 sets - 3 reps - 20 hold - Supine Lower Trunk Rotation  - 1 x daily - 7 x weekly - 1 sets - 5 reps - 5 hold - Hooklying Hamstring Stretch with Strap  - 1 x daily - 7 x weekly - 3 sets - 3 reps - 20 hold   ASSESSMENT:   CLINICAL IMPRESSION: PT was completed for prescribed therex  for lumbopelvic flexibility and strengthening. Pt tolerated without adverse effect. Pt reports no increased pain after last session. He was able to create a comfortable space on his floor to complete HEP without causing increased back pain, STG#1 met . He did not take tylenol this morning and rates his pain as 0/10 on arrival today. Pt is able to hold Plank on knees and Bridge for 60 sec each, LTG# 3 met.  He continues to struggle with pain at night and first rise in the morning. Began sidelying hip strength with good tolerance.    OBJECTIVE IMPAIRMENTS decreased activity tolerance, difficulty walking, decreased ROM, decreased strength, impaired flexibility, obesity, and pain.    ACTIVITY LIMITATIONS carrying, lifting, bending, sitting, standing, squatting, sleeping, stairs, locomotion level, and caring for others   PARTICIPATION LIMITATIONS: meal prep, cleaning, laundry, driving, shopping, community activity, and yard work   [place], Past/current experiences, Time since onset of injury/illness/exacerbation, and 1 comorbidity: DM2  are also affecting patient's functional outcome.       GOALS:   SHORT TERM GOALS: Target date: 09/17/2022   Pt will be Ind in an initial HEP  Baseline:started Goal status: MET   2.  Pt will voice understanding of measures to assist in pain reduction  Baseline:  Goal status: ONGOING     LONG TERM GOALS: Target date:  10/17/22   Pt will be Ind in a final HEP to maintain achieved LOF  Baseline: started Goal status: ONGOING   2.  Increase bilal knee strength to 5/5 and hip to 4+/5 for improved trunk support and function Baseline: see flow sheets Goal status: ONGOING   3.  Pt will be able to complete a bridge and a plank from his knees for 60" each for improved trunk strength and function Baseline: Bridge = 20" Status: 09/11/22: able to hold plank on knees for 60 sec , and Bridge for 60 sec  Goal status: MET   4.  Improve 5xSTS by MCID of 5" and 2MWT by MCID of 38f as indication of improved functional mobility  Baseline: 5xSTS 15.7 "; 2MWT 6367f at  normative standard Goal status: ONGOING   5.  Pt will report a decrease in LBP and L LE pain to 3-6/10 with daily activities for improved function and QOL Baseline: 5-9/10 Goal status: ONGOING   PLAN: PT FREQUENCY: 2x/week   PT DURATION: 6 weeks   PLANNED INTERVENTIONS: Therapeutic exercises, Therapeutic activity, Balance training, Gait training, Patient/Family education, Self Care, Joint mobilization, Aquatic Therapy, Dry Needling, Cryotherapy, Moist heat, Taping, Ultrasound, Manual therapy, and Re-evaluation.   PLAN FOR NEXT SESSION: Assess response to HEP; progress therex as indicated; use of modalities, manual therapy; and TPDN as indicated. Add sidelying hip strength and plank to HEP  Hessie Diener, PTA 09/11/22 9:22 AM Phone: [phone] Fax: [phone]

## 2022-09-15 ENCOUNTER — Ambulatory Visit: Payer: Medicaid Other | Attending: Internal Medicine | Admitting: Physical Therapy

## 2022-09-15 ENCOUNTER — Encounter: Payer: Self-pay | Admitting: Physical Therapy

## 2022-09-15 DIAGNOSIS — M6281 Muscle weakness (generalized): Secondary | ICD-10-CM | POA: Insufficient documentation

## 2022-09-15 DIAGNOSIS — G8929 Other chronic pain: Secondary | ICD-10-CM | POA: Insufficient documentation

## 2022-09-15 DIAGNOSIS — R262 Difficulty in walking, not elsewhere classified: Secondary | ICD-10-CM | POA: Diagnosis present

## 2022-09-15 DIAGNOSIS — M5442 Lumbago with sciatica, left side: Secondary | ICD-10-CM | POA: Insufficient documentation

## 2022-09-15 NOTE — Therapy (Signed)
OUTPATIENT PHYSICAL THERAPY TREATMENT NOTE   Patient Name: Parker Harvey MRN: [id number] DOB:May 25, 1961, 61 y.o., male Today's Date: 09/15/2022  PCP: Mack Hook, MD REFERRING PROVIDER: Mack Hook, MD  END OF SESSION:   PT End of Session - 09/15/22 0919     Visit Number 6    Number of Visits 13    Date for PT Re-Evaluation 10/17/22    Authorization Type [place] MEDICAID WELLCARE-WellCare Approved 10 PT visits from 09/02/2022 - 11/01/2022    Authorization - Visit Number 5    Authorization - Number of Visits 10    PT Start Time 0928    PT Stop Time 1015    PT Time Calculation (min) 47 min              Past Medical History:  Diagnosis Date   Chronic low back pain 2010   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  Previously followed by Dr. Brien Few for chronic pain.  Epidural injections help, but pt. concernd with having too many and they hurt.   Dental decay    Diabetes type 2, controlled ([place])    Hyperlipidemia 2017   Presbyopia    Past Surgical History:  Procedure Laterality Date   BUNIONECTOMY Left 2005   [place]   low back surgery  2012   Dr. Saintclair Halsted:  L4-5 and L5-S1 decompression and stabilization surgery   Patient Active Problem List   Diagnosis Date Noted   Decreased hearing 01/13/2022   Elevated liver enzymes 12/13/2019   Ingrown right greater toenail 12/13/2019   Diabetes type 2, controlled ([employer])    Insomnia 01/18/2016   Onychomycosis 01/18/2016   Mixed hyperlipidemia 12/16/2015   Dental decay 12/03/2015   Presbyopia 12/03/2015   Chronic back pain 08/18/2013    REFERRING DIAG: M54.9 (ICD-10-CM) - Acute left-sided back pain, unspecified back location  THERAPY DIAG:  Chronic bilateral low back pain with left-sided sciatica  Difficulty in walking, not elsewhere classified  Muscle weakness (generalized)  Rationale for Evaluation and Treatment Rehabilitation  SUBJECTIVE:                                                                                                                                                                                             SUBJECTIVE STATEMENT: Pt reports he had pain this morning (7/10) and had to take tylenol. Pain is also on the right side instead of the left side today.   PERTINENT HISTORY:  2012: L4-5 and L5-S1 decompression and stabilization surgery, DM2   PAIN:  Are you having pain? Yes: NPRS scale: 5/10, was 7/10 this morning Pain  location: low back and down L LE to knee Pain description: ache Aggravating factors: Prolonged standing and sitting, bending forward, lifting Relieving factors: Walking, medication, back brace On eval: Pain range: 5-9/10   PRECAUTIONS: None   WEIGHT BEARING RESTRICTIONS No   PATIENT GOALS: To function with less pain. 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   PATIENT SURVEYS:  Modified Oswestry 27/50, 54%    SCREENING FOR RED FLAGS: Bowel or bladder incontinence: No Cauda equina syndrome: No   COGNITION:           Overall cognitive status: Within functional limits for tasks assessed                          SENSATION: WFL   MUSCLE LENGTH: Hamstrings: Right WNLs deg; Left WNLs deg Marcello Moores test: Right WNLs deg; Left WNLs deg   POSTURE: rounded shoulders, forward head, and flexed trunk    PALPATION: TTP to the low back paraspinals and lumbar vertebrae   LUMBAR ROM:    Active  AROM  eval  Flexion Mod limited c min LBP  Extension Mod limited c mod LBP  Right lateral flexion Min limited c no LBP  Left lateral flexion Min limited c mod LBP  Right rotation Full c no LBP  Left rotation Min limited c mod LBP   (Blank rows = not  tested)   LOWER EXTREMITY ROM:                          Bilat hip ROM was WFLs and hip pain was not provoked, but LBP was provoked Active  Right eval Left eval  Hip flexion      Hip extension      Hip abduction      Hip adduction      Hip internal rotation      Hip external rotation      Knee flexion      Knee extension      Ankle dorsiflexion      Ankle plantarflexion      Ankle inversion      Ankle eversion       (Blank rows = not tested)   LOWER EXTREMITY MMT:                         Bridging 20 sec MMT Right eval Left eval Right Left  Hip flexion 4 4 4+/5 4+/5  Hip extension 4 4    Hip abduction 4 4    Hip adduction        Hip internal rotation        Hip external rotation 4 4    Knee flexion 4+ 4+ 5 5  Knee extension 4+ 4+ 5 5  Ankle dorsiflexion        Ankle plantarflexion        Ankle inversion        Ankle eversion         (Blank rows = not tested)   LUMBAR SPECIAL TESTS:  Straight leg raise test: Negative and Slump test: Negative   FUNCTIONAL TESTS:  5 times sit to stand:  09/02/22=15.7" bariatric chair 2 minute walk  test:  09/02/22=636 ft   GAIT: Distance walked: 230f Assistive device utilized: Single point cane Level of assistance: Modified independence Comments: Walks c SPC most of the time. Within the clinic for a short distance, gait pattern did not [facility]        TODAY'S TREATMENT  [facility] Adult PT Treatment:                                                DATE: 09/11/22 Therapeutic Exercise: Nustep L5 UE/LE x 7 min STS with 15# x 15 Plank on elbows and toes 10 sec x 5  Childs pose  and laterals Side hip abduction 10 x 2  Side red band clam 10 x 2  Bridge 10 sec 2 x 10 with 10 # weight - and red band clam Single leg bridge x 10 each, 10 sec  90/90 ab bracing,  10 sec  SKTC x 5 each , 10 sec  Figure 4 push and pull LTR   [facility] Adult PT Treatment:                                                 DATE: 09/11/22 Therapeutic Exercise: Nustep L5 UE/LE x 7 min 10 x STS, then x 10 with 10# x 10  Plank on elbows and knees 20 sec x 2 , then for 60 sec  Bridge 60 sec  Bridge 10 sec x 10 with 10 # weight  Ball squeeze x 10 Bridge with ball squeeze x 10  SKTC x 5 each , 10 sec  Mini crunch 10 x 2 Sidelying  hip abduction x15 each Side Clam x 15  Figure 4 push and pull 90/90 ab bracing  LTR   [facility] Adult PT Treatment:                                                DATE: 09/09/22 Therapeutic Exercise: Nustep L5 UE/LE x 7 min 10 x STS Bridge 10 sec x 10 Hooklying Clamshell BluTB 2 x 15 reps 3" Bridging x10 10sec with blue band at knees 90/90 abdominal bracing x5 10" Hip add sets ball squeeze x15 3" Mini crunch x15  SKTC x5 20" LTR x5 10" Figure 4 push and pull Therapeutic Activity:  Floor transfer x 1  Lunge to kneeling on airex  and back up x 4   [facility] Adult PT Treatment:                                                DATE: 09/05/22 Therapeutic Exercise: Nustep L5 UE/LE x 7 min Hooklying Clamshell BluTB 2 x 15 reps 3" Bridging x10 10sec with blue band at knees Mini crunch x15 Hip add sets ball squeeze x15 3" 90/90 abdominal bracing x5 10"  SKTC x5 20" LTR x5 10" Figure 4 push and pull 10 x STS   [facility] Adult PT  Treatment:                                                DATE: 09/02/22 Therapeutic Exercise: Mini crunch x15, verbal cue to minimize neck flexion 90/90 abdominal bracing x5 10" Bridging x10 10sec Hooklying Clamshell BluTB 2 x 10 reps 3" Hip add sets ball squeeze x15 5" SKTC x5 15" LTR x5 5" 5xSTS 2MWT Updated HEP  [facility] Adult PT Treatment:                                                DATE: 08/26/22 Therapeutic Exercise: Supine Bridge 5 reps 10 hold Supine Posterior Pelvic Tilt 10 reps 3 hold Hooklying Clamshell with Resistance 2 x 10 reps 3 hold   PATIENT EDUCATION:  Education details:Eval findings, POC, HEP, self care  Person educated:  Patient Education method: Explanation, Demonstration, Tactile cues, Verbal cues, and Handouts Education comprehension: verbalized understanding, returned demonstration, verbal cues required, and tactile cues required     HOME EXERCISE PROGRAM: Access Code: [email or web address] URL: [email or web address] Date: 09/02/2022 Prepared by: Gar Ponto  Exercises - Supine Bridge  - 2 x daily - 7 x weekly - 1 sets - 10 reps - 10 hold - Supine Posterior Pelvic Tilt  - 2 x daily - 7 x weekly - 1 sets - 10 reps - 3 hold - Supine 90/90 Abdominal Bracing  - 1 x daily - 7 x weekly - 1 sets - 5 reps - 10 hold - Hooklying Clamshell with Resistance  - 2 x daily - 7 x weekly - 1 sets - 10 reps - 3 hold - Hooklying Single Knee to Chest  - 1 x daily - 7 x weekly - 1 sets - 3 reps - 20 hold - Supine Lower Trunk Rotation  - 1 x daily - 7 x weekly - 1 sets - 5 reps - 5 hold - Hooklying Hamstring Stretch with Strap  - 1 x daily - 7 x weekly - 3 sets - 3 reps - 20 hold Added -- Clam with Resistance  - 1 x daily - 7 x weekly - 2 sets - 10 reps - Sidelying Hip Abduction  - 1 x daily - 7 x weekly - 2 sets - 10 reps - Standard Plank  - 1 x daily - 7 x weekly - 1 sets - 5 reps - 10 hold   ASSESSMENT:   CLINICAL IMPRESSION: PT was completed for prescribed therex  for lumbopelvic flexibility and strengthening. Pt tolerated without adverse effect.  PT reports right sided LBP on arrival today and is able to participate in prescribed therex with reports of muscle burning, no increased pain. He reports daily compliance with HEP. His knee MMT and hip flexion MMT have improved. He was given updated HEP to progress hip abduction strength and core strength. He has partially met LTG#2.    OBJECTIVE IMPAIRMENTS decreased activity tolerance, difficulty walking, decreased ROM, decreased strength, impaired flexibility, obesity, and pain.    ACTIVITY LIMITATIONS carrying, lifting, bending, sitting, standing, squatting, sleeping,  stairs, locomotion level, and caring for others   PARTICIPATION LIMITATIONS: meal prep, cleaning, laundry, driving, shopping, community activity, and yard work   [place], Past/current experiences, Time since  onset of injury/illness/exacerbation, and 1 comorbidity: DM2  are also affecting patient's functional outcome.       GOALS:   SHORT TERM GOALS: Target date: 09/17/2022   Pt will be Ind in an initial HEP  Baseline:started Goal status: MET   2.  Pt will voice understanding of measures to assist in pain reduction  Baseline:  Goal status: ONGOING     LONG TERM GOALS: Target date: 10/17/22   Pt will be Ind in a final HEP to maintain achieved LOF  Baseline: started Goal status: ONGOING   2.  Increase bilal knee strength to 5/5 and hip to 4+/5 for improved trunk support and function Baseline: see flow sheet Status: 09/15/22: (knees 5/5) Goal status: PARTIALLY MET   3.  Pt will be able to complete a bridge and a plank from his knees for 60" each for improved trunk strength and function Baseline: Bridge = 20" Status: 09/11/22: able to hold plank on knees for 60 sec , and Bridge for 60 sec  Goal status: MET   4.  Improve 5xSTS by MCID of 5" and 2MWT by MCID of 70f as indication of improved functional mobility  Baseline: 5xSTS 15.7 "; 2MWT 6350f at normative standard Goal status: ONGOING   5.  Pt will report a decrease in LBP and L LE pain to 3-6/10 with daily activities for improved function and QOL Baseline: 5-9/10 Status: 09/15/22: has increased pain in the mornings intermittently up to 7/10 Goal status: ONGOING   PLAN: PT FREQUENCY: 2x/week   PT DURATION: 6 weeks   PLANNED INTERVENTIONS: Therapeutic exercises, Therapeutic activity, Balance training, Gait training, Patient/Family education, Self Care, Joint mobilization, Aquatic Therapy, Dry Needling, Cryotherapy, Moist heat, Taping, Ultrasound, Manual therapy, and Re-evaluation.   PLAN FOR NEXT SESSION:  Assess response to HEP; progress therex as indicated; use of modalities, manual therapy; and TPDN as indicated.   JeHessie DienerPTA 09/15/22 10:22 AM Phone: [phone]: [phone]

## 2022-09-16 NOTE — Therapy (Signed)
OUTPATIENT PHYSICAL THERAPY TREATMENT NOTE   Patient Name: Parker Harvey MRN: [id number] DOB:1961-04-26, 61 y.o., male Today's Date: 09/17/2022  PCP: Mack Hook, MD REFERRING PROVIDER: Mack Hook, MD  END OF SESSION:   PT End of Session - 09/17/22 0851     Visit Number 7    Number of Visits 13    Date for PT Re-Evaluation 10/17/22    Authorization Type [place] MEDICAID WELLC[employer]-WellCare Approved 10 PT visits from 09/02/2022 - 11/01/2022    Authorization - Visit Number 6    Authorization - Number of Visits 10    PT Start Time 0845    PT Stop Time 0927    PT Time Calculation (min) 42 min    Equipment Utilized During Treatment Other (comment)   [place]   Activity Tolerance Patient tolerated treatment well    Behavior During Therapy [facility] for tasks assessed/performed               Past Medical History:  Diagnosis Date   Chronic low back pain 2010   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  Previously followed by Dr. Brien Few for chronic pain.  Epidural injections help, but pt. concernd with having too many and they hurt.   Dental decay    Diabetes type 2, controlled ([place])    Hyperlipidemia 2017   Presbyopia    Past Surgical History:  Procedure Laterality Date   BUNIONECTOMY Left 2005   [place]   low back surgery  2012   Dr. Saintclair Halsted:  L4-5 and L5-S1 decompression and stabilization surgery   Patient Active Problem List   Diagnosis Date Noted   Decreased hearing 01/13/2022   Elevated liver enzymes 12/13/2019   Ingrown right greater toenail 12/13/2019   Diabetes type 2, controlled ([place])    Insomnia 01/18/2016   Onychomycosis 01/18/2016   Mixed hyperlipidemia 12/16/2015   Dental decay 12/03/2015   Presbyopia 12/03/2015   Chronic back pain 08/18/2013    REFERRING DIAG: M54.9 (ICD-10-CM) - Acute left-sided back pain, unspecified back location  THERAPY DIAG:  Chronic bilateral low back pain with left-sided  sciatica  Difficulty in walking, not elsewhere classified  Muscle weakness (generalized)  Rationale for Evaluation and Treatment Rehabilitation  SUBJECTIVE:                                                                                                                                                                                            SUBJECTIVE STATEMENT: [place], pt his low back pain has been lees  PERTINENT HISTORY:  2012: L4-5 and L5-S1 decompression and stabilization surgery, DM2   PAIN:  Are you having pain? Yes: NPRS scale: 6/10, Pain location: low back and down L LE to knee Pain description: ache Aggravating factors: Prolonged standing and sitting, bending forward, lifting Relieving factors: Walking, medication, back brace On eval: Pain range: 5-9/10   PRECAUTIONS: None   WEIGHT BEARING RESTRICTIONS No   PATIENT GOALS: To function with less pain. 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   PATIENT SURVEYS:  Modified Oswestry 27/50, 54%    SCREENING FOR RED FLAGS: Bowel or bladder incontinence: No Cauda equina syndrome: No   COGNITION:           Overall cognitive status: Within functional limits for tasks assessed                          SENSATION: WFL   MUSCLE LENGTH: Hamstrings: Right WNLs deg; Left WNLs deg Marcello Moores test: Right WNLs deg; Left WNLs deg   POSTURE: rounded shoulders, forward head, and flexed trunk    PALPATION: TTP to the low back paraspinals and lumbar vertebrae   LUMBAR ROM:    Active  AROM  eval  Flexion Mod limited c min LBP  Extension Mod limited c mod LBP  Right lateral flexion Min limited c no LBP  Left lateral flexion Min limited c mod LBP   Right rotation Full c no LBP  Left rotation Min limited c mod LBP   (Blank rows = not tested)   LOWER EXTREMITY ROM:                          Bilat hip ROM was WFLs and hip pain was not provoked, but LBP was provoked Active  Right eval Left eval  Hip flexion      Hip extension      Hip abduction      Hip adduction      Hip internal rotation      Hip external rotation      Knee flexion      Knee extension      Ankle dorsiflexion      Ankle plantarflexion      Ankle inversion      Ankle eversion       (Blank rows = not tested)   LOWER EXTREMITY MMT:                         Bridging 20 sec MMT Right eval Left eval Right Left  Hip flexion 4 4 4+/5 4+/5  Hip extension 4 4    Hip abduction 4 4    Hip adduction        Hip internal rotation        Hip external rotation 4 4    Knee flexion 4+ 4+ 5 5  Knee extension 4+ 4+ 5 5  Ankle dorsiflexion        Ankle plantarflexion        Ankle inversion        Ankle eversion         (Blank rows = not tested)   LUMBAR SPECIAL TESTS:  Straight leg raise test: Negative and Slump test: Negative   FUNCTIONAL TESTS:  5 times sit  to stand:  09/02/22=15.7" bariatric chair 2 minute walk test:  09/02/22=636 ft   GAIT: Distance walked: 261f Assistive device utilized: Single point cane Level of assistance: Modified independence Comments: Walks c SPC most of the time. Within the clinic for a short distance, gait pattern did not change c the SPC vs. [facility] Gait pattern was WNL        TODAY'S TREATMENT  [facility] Adult PT Treatment:                                                DATE: 09/17/22 Therapeutic Exercise: Nustep L7 UE/LE x 7 min STS with 20# 2x15 Plank on elbows and toes 10 sec x 5  Childs pose and laterals banded side steps 253f8 GTB Bridge 10 sec 2 x 10 with 10 # weight - and red band clam Single leg bridge x 10 each, 10 sec  90/90 ab bracing, x10 10 sec  Dead Bug x15, verbal cueing for proper completion SKTC x  3  each , 10 sec  Figure 4 push and pull x2 20" LTR x5  [facility] Adult PT Treatment:                                                DATE: 09/11/22 Therapeutic Exercise: Nustep L5 UE/LE x 7 min STS with 15# x 15 Plank on elbows and toes 10 sec x 5  Childs pose  and laterals Side hip abduction 10 x 2  Side red band clam 10 x 2  Bridge 10 sec 2 x 10 with 10 # weight - and red band clam Single leg bridge x 10 each, 10 sec  90/90 ab bracing,  10 sec  SKTC x 5 each , 10 sec  Figure 4 push and pull LTR   [facility] Adult PT Treatment:                                                DATE: 09/11/22 Therapeutic Exercise: Nustep L5 UE/LE x 7 min 10 x STS, then x 10 with 10# x 10  Plank on elbows and knees 20 sec x 2 , then for 60 sec  Bridge 60 sec  Bridge 10 sec x 10 with 10 # weight  Ball squeeze x 10 Bridge with ball squeeze x 10  SKTC x 5 each , 10 sec  Mini crunch 10 x 2 Sidelying  hip abduction x15 each Side Clam x 15  Figure 4 push and pull 90/90 ab bracing  LTR   [facility] Adult PT Treatment:                                                DATE: 09/09/22 Therapeutic Exercise: Nustep L5 UE/LE x 7 min 10 x STS Bridge 10 sec x 10 Hooklying Clamshell BluTB 2 x 15 reps 3" Bridging x10 10sec with blue band at knees 90/90 abdominal bracing x5 10" Hip add sets  ball squeeze x15 3" Mini crunch x15  SKTC x5 20" LTR x5 10" Figure 4 push and pull Therapeutic Activity:  Floor transfer x 1  Lunge to kneeling on airex  and back up x 4     PATIENT EDUCATION:  Education details:Eval findings, POC, HEP, self care  Person educated: Patient Education method: Explanation, Demonstration, Tactile cues, Verbal cues, and Handouts Education comprehension: verbalized understanding, returned demonstration, verbal cues required, and tactile cues required     HOME EXERCISE PROGRAM: Access Code: [email or web address] URL: [email or web address] Date: 09/02/2022 Prepared by: Gar Ponto  Exercises -  Supine Bridge  - 2 x daily - 7 x weekly - 1 sets - 10 reps - 10 hold - Supine Posterior Pelvic Tilt  - 2 x daily - 7 x weekly - 1 sets - 10 reps - 3 hold - Supine 90/90 Abdominal Bracing  - 1 x daily - 7 x weekly - 1 sets - 5 reps - 10 hold - Hooklying Clamshell with Resistance  - 2 x daily - 7 x weekly - 1 sets - 10 reps - 3 hold - Hooklying Single Knee to Chest  - 1 x daily - 7 x weekly - 1 sets - 3 reps - 20 hold - Supine Lower Trunk Rotation  - 1 x daily - 7 x weekly - 1 sets - 5 reps - 5 hold - Hooklying Hamstring Stretch with Strap  - 1 x daily - 7 x weekly - 3 sets - 3 reps - 20 hold Added -- Clam with Resistance  - 1 x daily - 7 x weekly - 2 sets - 10 reps - Sidelying Hip Abduction  - 1 x daily - 7 x weekly - 2 sets - 10 reps - Standard Plank  - 1 x daily - 7 x weekly - 1 sets - 5 reps - 10 hold   ASSESSMENT:   CLINICAL IMPRESSION: PT was completed for lumbopelvic flexibility and strengthening with progressive demand for strengthening. Verbal cueing was needed with the introduction of the dead bug therex.  Pt tolerated without adverse effect.  PT reports overall improvement in pain and consistent completion of his HEP. Pt is making appropriate progress. Pt will continue to benefit from skilled PT to address impairments for improved function with lss pain.    OBJECTIVE IMPAIRMENTS decreased activity tolerance, difficulty walking, decreased ROM, decreased strength, impaired flexibility, obesity, and pain.    ACTIVITY LIMITATIONS carrying, lifting, bending, sitting, standing, squatting, sleeping, stairs, locomotion level, and caring for others   PARTICIPATION LIMITATIONS: meal prep, cleaning, laundry, driving, shopping, community activity, and yard work   [place], Past/current experiences, Time since onset of injury/illness/exacerbation, and 1 comorbidity: DM2  are also affecting patient's functional outcome.       GOALS:   SHORT TERM GOALS: Target date: 09/17/2022    Pt will be Ind in an initial HEP  Baseline:started Goal status: MET   2.  Pt will voice understanding of measures to assist in pain reduction  Baseline:  Status; uses HEP, stays active, takes tylenol occasionally Goal status:MET 09/17/22     LONG TERM GOALS: Target date: 10/17/22   Pt will be Ind in a final HEP to maintain achieved LOF  Baseline: started Goal status: ONGOING   2.  Increase bilal knee strength to 5/5 and hip to 4+/5 for improved trunk support and function Baseline: see flow sheet Status: 09/15/22: (knees 5/5) Goal status: PARTIALLY MET   3.  Pt will be able  to complete a bridge and a plank from his knees for 60" each for improved trunk strength and function Baseline: Bridge = 20" Status: 09/11/22: able to hold plank on knees for 60 sec , and Bridge for 60 sec  Goal status: MET   4.  Improve 5xSTS by MCID of 5" and 2MWT by MCID of 26f as indication of improved functional mobility  Baseline: 5xSTS 15.7 "; 2MWT 6350f at normative standard Goal status: ONGOING   5.  Pt will report a decrease in LBP and L LE pain to 3-6/10 with daily activities for improved function and QOL Baseline: 5-9/10 Status: 09/15/22: has increased pain in the mornings intermittently up to 7/10 Goal status: ONGOING   PLAN: PT FREQUENCY: 2x/week   PT DURATION: 6 weeks   PLANNED INTERVENTIONS: Therapeutic exercises, Therapeutic activity, Balance training, Gait training, Patient/Family education, Self Care, Joint mobilization, Aquatic Therapy, Dry Needling, Cryotherapy, Moist heat, Taping, Ultrasound, Manual therapy, and Re-evaluation.   PLAN FOR NEXT SESSION: Assess response to HEP; progress therex as indicated; use of modalities, manual therapy; and TPDN as indicated.   Bernese Doffing MS, PT 09/17/22 9:36 AM

## 2022-09-17 ENCOUNTER — Ambulatory Visit: Payer: Medicaid Other

## 2022-09-17 DIAGNOSIS — R262 Difficulty in walking, not elsewhere classified: Secondary | ICD-10-CM

## 2022-09-17 DIAGNOSIS — G8929 Other chronic pain: Secondary | ICD-10-CM

## 2022-09-17 DIAGNOSIS — M6281 Muscle weakness (generalized): Secondary | ICD-10-CM

## 2022-09-17 DIAGNOSIS — M5442 Lumbago with sciatica, left side: Secondary | ICD-10-CM | POA: Diagnosis not present

## 2022-09-19 NOTE — Therapy (Signed)
OUTPATIENT PHYSICAL THERAPY TREATMENT NOTE   Patient Name: Parker Harvey MRN: [id number] DOB:03-12-1961, 61 y.o., male Today's Date: 09/22/2022  PCP: Mack Hook, MD REFERRING PROVIDER: Mack Hook, MD  END OF SESSION:   PT End of Session - 09/22/22 0851     Visit Number 9    Number of Visits 13    Date for PT Re-Evaluation 10/17/22    Authorization Type [place] MEDICAID WELLCARE-WellCare Approved 10 PT visits from 09/02/2022 - 11/01/2022    Authorization - Visit Number 7    Authorization - Number of Visits 10    PT Start Time 2637    PT Stop Time 0930    PT Time Calculation (min) 40 min                Past Medical History:  Diagnosis Date   Chronic low back pain 2010   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  Previously followed by Dr. Brien Few for chronic pain.  Epidural injections help, but pt. concernd with having too many and they hurt.   Dental decay    Diabetes type 2, controlled ([place])    Hyperlipidemia 2017   Presbyopia    Past Surgical History:  Procedure Laterality Date   BUNIONECTOMY Left 2005   [place]   low back surgery  2012   Dr. Saintclair Halsted:  L4-5 and L5-S1 decompression and stabilization surgery   Patient Active Problem List   Diagnosis Date Noted   Decreased hearing 01/13/2022   Elevated liver enzymes 12/13/2019   Ingrown right greater toenail 12/13/2019   Diabetes type 2, controlled ([place])    Insomnia 01/18/2016   Onychomycosis 01/18/2016   Mixed hyperlipidemia 12/16/2015   Dental decay 12/03/2015   Presbyopia 12/03/2015   Chronic back pain 08/18/2013    REFERRING DIAG: M54.9 (ICD-10-CM) - Acute left-sided back pain, unspecified back location  THERAPY DIAG:  Chronic bilateral low back pain with left-sided sciatica  Difficulty in walking, not elsewhere classified  Muscle weakness (generalized)  Rationale for Evaluation and Treatment Rehabilitation  SUBJECTIVE:                                                                                                                                                                                             SUBJECTIVE STATEMENT: Little shooting pain every morning in the sides and lower back. No pain in leg right now.   PERTINENT HISTORY:  2012: L4-5 and L5-S1 decompression and stabilization surgery, DM2   PAIN:  Are you having pain? Yes: NPRS scale: 6/10, Pain location: low back and down L LE to knee Pain description:  ache Aggravating factors: Prolonged standing and sitting, bending forward, lifting Relieving factors: Walking, medication, back brace On eval: Pain range: 5-9/10   PRECAUTIONS: None   WEIGHT BEARING RESTRICTIONS No   PATIENT GOALS: To function with less pain. 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   PATIENT SURVEYS:  Modified Oswestry 27/50, 54%    SCREENING FOR RED FLAGS: Bowel or bladder incontinence: No Cauda equina syndrome: No   COGNITION:           Overall cognitive status: Within functional limits for tasks assessed                          SENSATION: WFL   MUSCLE LENGTH: Hamstrings: Right WNLs deg; Left WNLs deg Marcello Moores test: Right WNLs deg; Left WNLs deg   POSTURE: rounded shoulders, forward head, and flexed trunk    PALPATION: TTP to the low back paraspinals and lumbar vertebrae   LUMBAR ROM:    Active  AROM  eval  Flexion Mod limited c min LBP  Extension Mod limited c mod LBP  Right lateral flexion Min limited c no LBP  Left lateral flexion Min limited c mod LBP  Right rotation Full c no LBP  Left rotation Min limited c mod LBP   (Blank rows = not tested)   LOWER EXTREMITY ROM:                           Bilat hip ROM was WFLs and hip pain was not provoked, but LBP was provoked Active  Right eval Left eval  Hip flexion      Hip extension      Hip abduction      Hip adduction      Hip internal rotation      Hip external rotation      Knee flexion      Knee extension      Ankle dorsiflexion      Ankle plantarflexion      Ankle inversion      Ankle eversion       (Blank rows = not tested)   LOWER EXTREMITY MMT:                         Bridging 20 sec MMT Right eval Left eval Right Left  Hip flexion 4 4 4+/5 4+/5  Hip extension 4 4    Hip abduction 4 4    Hip adduction        Hip internal rotation        Hip external rotation 4 4    Knee flexion 4+ 4+ 5 5  Knee extension 4+ 4+ 5 5  Ankle dorsiflexion        Ankle plantarflexion        Ankle inversion        Ankle eversion         (Blank rows = not tested)   LUMBAR SPECIAL TESTS:  Straight leg raise test: Negative and Slump test: Negative   FUNCTIONAL TESTS:  5 times sit to stand:  09/02/22=15.7" bariatric chair 2 minute walk test:  09/02/22=636 ft   GAIT: Distance walked: 285f Assistive  device utilized: Single point cane Level of assistance: Modified independence Comments: Walks c SPC most of the time. Within the clinic for a short distance, gait pattern did not change c the SPC vs. Not c the [facility]. Gait pattern was WNL        TODAY'S TREATMENT   [facility] Adult PT Treatment:                                                DATE: 09/22/22 Therapeutic Exercise: Nustep L7 UE/LE x 7 min Plank on elbows and toes 20 sec , 10 sec, 20 sec Childs pose and laterals Bridge 10 sec 2 x 10 with 10 # weight - and blue band clam Single leg bridge x 10 each, 10 sec  90/90 ab bracing, x10 10 sec  Dead bug  SIde hip abduction  10 x 2 each Side clam blue band x 20 each Figure 4 push and pull x2 20" LTR x5 STS with 20# 2x15   [facility] Adult PT Treatment:                                                DATE: 09/17/22 Therapeutic  Exercise: Nustep L6 UE/LE x 7 min STS with 20# 2x15 Plank on elbows and toes 10 sec x 5  Childs pose and laterals banded side steps 59f 8 GTB Bridge 10 sec 2 x 10 with 10 # weight - and red band clam Single leg bridge x 10 each, 10 sec  Dead Bug x15, verbal cueing for proper completion (supine march with arms) SKTC x  3 each , 10 sec  Figure 4 push and pull x2 20" LTR x5  [facility] Adult PT Treatment:                                                DATE: 09/11/22 Therapeutic Exercise: Nustep L5 UE/LE x 7 min STS with 15# x 15 Plank on elbows and toes 10 sec x 5  Childs pose  and laterals Side hip abduction 10 x 2  Side red band clam 10 x 2  Bridge 10 sec 2 x 10 with 10 # weight - and red band clam Single leg bridge x 10 each, 10 sec  90/90 ab bracing,  10 sec  SKTC x 5 each , 10 sec  Figure 4 push and pull LTR   [facility] Adult PT Treatment:                                                DATE: 09/11/22 Therapeutic Exercise: Nustep L5 UE/LE x 7 min 10 x STS, then x 10 with 10# x 10  Plank on elbows and knees 20 sec x 2 , then for 60 sec  Bridge 60 sec  Bridge 10 sec x 10 with 10 # weight  Ball squeeze x 10 Bridge with ball squeeze x 10  SKTC x 5 each , 10 sec  Mini crunch  10 x 2 Sidelying  hip abduction x15 each Side Clam x 15  Figure 4 push and pull 90/90 ab bracing  LTR   [facility] Adult PT Treatment:                                                DATE: 09/09/22 Therapeutic Exercise: Nustep L5 UE/LE x 7 min 10 x STS Bridge 10 sec x 10 Hooklying Clamshell BluTB 2 x 15 reps 3" Bridging x10 10sec with blue band at knees 90/90 abdominal bracing x5 10" Hip add sets ball squeeze x15 3" Mini crunch x15  SKTC x5 20" LTR x5 10" Figure 4 push and pull Therapeutic Activity:  Floor transfer x 1  Lunge to kneeling on airex  and back up x 4     PATIENT EDUCATION:  Education details:Eval findings, POC, HEP, self care  Person educated: Patient Education method: Explanation,  Demonstration, Tactile cues, Verbal cues, and Handouts Education comprehension: verbalized understanding, returned demonstration, verbal cues required, and tactile cues required     HOME EXERCISE PROGRAM: Access Code: [email or web address] URL: [email or web address] Date: 09/02/2022 Prepared by: Gar Ponto  Exercises - Supine Bridge  - 2 x daily - 7 x weekly - 1 sets - 10 reps - 10 hold - Supine Posterior Pelvic Tilt  - 2 x daily - 7 x weekly - 1 sets - 10 reps - 3 hold - Supine 90/90 Abdominal Bracing  - 1 x daily - 7 x weekly - 1 sets - 5 reps - 10 hold - Hooklying Clamshell with Resistance  - 2 x daily - 7 x weekly - 1 sets - 10 reps - 3 hold - Hooklying Single Knee to Chest  - 1 x daily - 7 x weekly - 1 sets - 3 reps - 20 hold - Supine Lower Trunk Rotation  - 1 x daily - 7 x weekly - 1 sets - 5 reps - 5 hold - Hooklying Hamstring Stretch with Strap  - 1 x daily - 7 x weekly - 3 sets - 3 reps - 20 hold Added -- Clam with Resistance  - 1 x daily - 7 x weekly - 2 sets - 10 reps - Sidelying Hip Abduction  - 1 x daily - 7 x weekly - 2 sets - 10 reps - Standard Plank  - 1 x daily - 7 x weekly - 1 sets - 5 reps - 10 hold   ASSESSMENT:   CLINICAL IMPRESSION: PT was completed for lumbopelvic flexibility and strengthening with progressive demand for strengthening. Verbal cueing was needed with the continuation of the dead bug therex and lateral hip strength in sideying.  Pt tolerated without adverse effect.  PT reports overall improvement in pain and consistent completion of his HEP. Pt is making appropriate progress. Pt will continue to benefit from skilled PT to address impairments for improved function with lss pain.    OBJECTIVE IMPAIRMENTS decreased activity tolerance, difficulty walking, decreased ROM, decreased strength, impaired flexibility, obesity, and pain.    ACTIVITY LIMITATIONS carrying, lifting, bending, sitting, standing, squatting, sleeping, stairs, locomotion level, and  caring for others   PARTICIPATION LIMITATIONS: meal prep, cleaning, laundry, driving, shopping, community activity, and yard work   [place], Past/current experiences, Time since onset of injury/illness/exacerbation, and 1 comorbidity: DM2  are also affecting patient's functional outcome.  GOALS:   SHORT TERM GOALS: Target date: 09/17/2022   Pt will be Ind in an initial HEP  Baseline:started Goal status: MET   2.  Pt will voice understanding of measures to assist in pain reduction  Baseline:  Status; uses HEP, stays active, takes tylenol occasionally Goal status:MET 09/17/22     LONG TERM GOALS: Target date: 10/17/22   Pt will be Ind in a final HEP to maintain achieved LOF  Baseline: started Goal status: ONGOING   2.  Increase bilal knee strength to 5/5 and hip to 4+/5 for improved trunk support and function Baseline: see flow sheet Status: 09/15/22: (knees 5/5) Goal status: PARTIALLY MET   3.  Pt will be able to complete a bridge and a plank from his knees for 60" each for improved trunk strength and function Baseline: Bridge = 20" Status: 09/11/22: able to hold plank on knees for 60 sec , and Bridge for 60 sec  Goal status: MET   4.  Improve 5xSTS by MCID of 5" and 2MWT by MCID of 36f as indication of improved functional mobility  Baseline: 5xSTS 15.7 "; 2MWT 63101f at normative standard Goal status: ONGOING   5.  Pt will report a decrease in LBP and L LE pain to 3-6/10 with daily activities for improved function and QOL Baseline: 5-9/10 Status: 09/15/22: has increased pain in the mornings intermittently up to 7/10 Goal status: ONGOING   PLAN: PT FREQUENCY: 2x/week   PT DURATION: 6 weeks   PLANNED INTERVENTIONS: Therapeutic exercises, Therapeutic activity, Balance training, Gait training, Patient/Family education, Self Care, Joint mobilization, Aquatic Therapy, Dry Needling, Cryotherapy, Moist heat, Taping, Ultrasound, Manual therapy, and  Re-evaluation.   PLAN FOR NEXT SESSION: Assess response to HEP; progress therex as indicated; use of modalities, manual therapy; and TPDN as indicated.   Allen Ralls MS, PT 09/22/22 12:49 PM

## 2022-09-22 ENCOUNTER — Ambulatory Visit: Payer: Medicaid Other | Admitting: Physical Therapy

## 2022-09-22 ENCOUNTER — Encounter: Payer: Self-pay | Admitting: Physical Therapy

## 2022-09-22 DIAGNOSIS — R262 Difficulty in walking, not elsewhere classified: Secondary | ICD-10-CM

## 2022-09-22 DIAGNOSIS — G8929 Other chronic pain: Secondary | ICD-10-CM

## 2022-09-22 DIAGNOSIS — M5442 Lumbago with sciatica, left side: Secondary | ICD-10-CM | POA: Diagnosis not present

## 2022-09-22 DIAGNOSIS — M6281 Muscle weakness (generalized): Secondary | ICD-10-CM

## 2022-09-24 ENCOUNTER — Ambulatory Visit: Payer: Medicaid Other

## 2022-09-24 DIAGNOSIS — R262 Difficulty in walking, not elsewhere classified: Secondary | ICD-10-CM

## 2022-09-24 DIAGNOSIS — G8929 Other chronic pain: Secondary | ICD-10-CM

## 2022-09-24 DIAGNOSIS — M5442 Lumbago with sciatica, left side: Secondary | ICD-10-CM | POA: Diagnosis not present

## 2022-09-24 DIAGNOSIS — M6281 Muscle weakness (generalized): Secondary | ICD-10-CM

## 2022-09-24 NOTE — Therapy (Signed)
OUTPATIENT PHYSICAL THERAPY TREATMENT NOTE   Patient Name: Parker Harvey MRN: [id number] DOB:1961-12-02, 61 y.o., male Today's Date: 09/24/2022  PCP: Mack Hook, MD REFERRING PROVIDER: Mack Hook, MD  END OF SESSION:   PT End of Session - 09/24/22 0859     Visit Number 10    Number of Visits 13    Date for PT Re-Evaluation 10/17/22    Authorization Type [place] MEDICAID [employer] Approved 10 PT visits from 09/02/2022 - 11/01/2022    Authorization - Visit Number 8    Authorization - Number of Visits 10    PT Start Time 2229    PT Stop Time 0930    PT Time Calculation (min) 40 min    Equipment Utilized During Treatment Other (comment)   [place]   Activity Tolerance Patient tolerated treatment well    Behavior During Therapy [facility] for tasks assessed/performed                 Past Medical History:  Diagnosis Date   Chronic low back pain 2010   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  Previously followed by Dr. Brien Few for chronic pain.  Epidural injections help, but pt. concernd with having too many and they hurt.   Dental decay    Diabetes type 2, controlled ([place])    Hyperlipidemia 2017   Presbyopia    Past Surgical History:  Procedure Laterality Date   BUNIONECTOMY Left 2005   [place]   low back surgery  2012   Dr. Saintclair Halsted:  L4-5 and L5-S1 decompression and stabilization surgery   Patient Active Problem List   Diagnosis Date Noted   Decreased hearing 01/13/2022   Elevated liver enzymes 12/13/2019   Ingrown right greater toenail 12/13/2019   Diabetes type 2, controlled ([place])    Insomnia 01/18/2016   Onychomycosis 01/18/2016   Mixed hyperlipidemia 12/16/2015   Dental decay 12/03/2015   Presbyopia 12/03/2015   Chronic back pain 08/18/2013    REFERRING DIAG: M54.9 (ICD-10-CM) - Acute left-sided back pain, unspecified back location  THERAPY DIAG:  Chronic bilateral low back pain with left-sided  sciatica  Difficulty in walking, not elsewhere classified  Muscle weakness (generalized)  Rationale for Evaluation and Treatment Rehabilitation  SUBJECTIVE:                                                                                                                                                                                            SUBJECTIVE STATEMENT: P reports his low back bothered him last night, and it most often hurts in the morning in bed and starting to move around  PERTINENT HISTORY:  2012: L4-5 and L5-S1 decompression and stabilization surgery, DM2   PAIN:  Are you having pain? Yes: NPRS scale: ^/10, Pain location: low back and down L LE to knee Pain description: ache Aggravating factors: Prolonged standing and sitting, bending forward, lifting Relieving factors: Walking, medication, back brace On eval: Pain range: 5-9/10   PRECAUTIONS: None   WEIGHT BEARING RESTRICTIONS No   PATIENT GOALS: To function with less pain. 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   PATIENT SURVEYS:  Modified Oswestry 27/50, 54%; 09/24/22= 18/36%   SCREENING FOR RED FLAGS: Bowel or bladder incontinence: No Cauda equina syndrome: No   COGNITION:           Overall cognitive status: Within functional limits for tasks assessed                          SENSATION: WFL   MUSCLE LENGTH: Hamstrings: Right WNLs deg; Left WNLs deg [name] test: Right WNLs deg; Left WNLs deg   POSTURE: rounded shoulders, forward head, and flexed trunk    PALPATION: TTP to the low back paraspinals and lumbar vertebrae   LUMBAR ROM:    Active  AROM  eval  Flexion Mod limited c min LBP  Extension Mod limited c mod  LBP  Right lateral flexion Min limited c no LBP  Left lateral flexion Min limited c mod LBP  Right rotation Full c no LBP  Left rotation Min limited c mod LBP   (Blank rows = not tested)   LOWER EXTREMITY ROM:                          Bilat hip ROM was WFLs and hip pain was not provoked, but LBP was provoked Active  Right eval Left eval  Hip flexion      Hip extension      Hip abduction      Hip adduction      Hip internal rotation      Hip external rotation      Knee flexion      Knee extension      Ankle dorsiflexion      Ankle plantarflexion      Ankle inversion      Ankle eversion       (Blank rows = not tested)   LOWER EXTREMITY MMT:                         Bridging 20 sec MMT Right eval Left eval Right Left  Hip flexion 4 4 4+/5 4+/5  Hip extension 4 4    Hip abduction 4 4    Hip adduction        Hip internal rotation        Hip external rotation 4 4    Knee flexion 4+ 4+ 5 5  Knee extension 4+ 4+ 5 5  Ankle dorsiflexion        Ankle plantarflexion        Ankle inversion        Ankle eversion         (Blank rows = not tested)   LUMBAR SPECIAL TESTS:  Straight leg raise test: Negative and Slump test: Negative   FUNCTIONAL TESTS:  5 times sit to stand:  09/02/22=15.7" bariatric chair; 09/24/22= 8.9" from  2 minute walk test:  09/02/22=636 ft   GAIT: Distance walked: 223f Assistive device utilized: Single point cane Level of assistance: Modified independence Comments: Walks c SPC most of the time. Within the clinic for a short distance, gait pattern did not change c the SPC [facility]. [facility] WNL        TODAY'S TREATMENT  [facility] Adult PT Treatment:                                                DATE: 09/24/22 Therapeutic Exercise: Nustep L7 UE/LE x 7 min Plank on elbows and toes x10 10 sec,  Childs pose and laterals Bridge 10 sec 2 x 10 with 10 # weight - and blue band clam Single leg bridge x 10 each, 10 sec  90/90 ab bracing, x10  10 sec  Supine clam blue band x 20 each Figure 4 push and pull x2 20" LTR x5 Piriformis stretch x2 20" Cybex leg press 2x10 80#  Self Care: Mod Oswestry completed and reviewed with pt.  [facility]                                                DATE: 09[facility]22[facility]22 Therapeutic Exercise: Nustep L7 UE/LE x 7 min Plank on elbows and toes 20 sec , 10 sec, 20 sec Childs pose and laterals Single leg bridge x 10 each, 10 sec  90/90 ab bracing, x10 10 sec  Dead bug  SIde hip abduction  10 x 2 each Side clam blue band x 20 each Figure 4 push and pull x2 20" LTR x5 STS with 20# 2x15   [facility] Adult PT Treatment:                                                DATE: 09/17/22 Therapeutic Exercise: Nustep L6 UE/LE x 7 min STS with 20# 2x15 Plank on elbows and toes 10 sec x 5  Childs pose and laterals banded side steps 262f8 GTB Bridge 10 sec 2 x 10 with 10 # weight - and red band clam Single leg bridge x 10 each, 10 sec  Dead Bug x15, verbal cueing for proper completion (supine march with arms) SKTC x  3 each , 10 sec  Figure 4 push and pull x2 20" LTR x5  [facility] Adult PT Treatment:                                                DATE: 09/11/22 Therapeutic Exercise: Nustep L5 UE/LE x 7 min STS with 15# x 15 Plank on elbows and toes 10 sec x 5  Childs pose  and laterals Side hip abduction 10 x 2  Side red band clam 10 x 2  Bridge 10 sec  2 x 10 with 10 # weight - and red band clam Single leg bridge x 10 each, 10 sec  90/90 ab bracing,  10 sec  SKTC x 5 each , 10 sec  Figure 4 push and pull LTR   [facility] Adult PT Treatment:                                                DATE: 09/11/22 Therapeutic Exercise: Nustep L5 UE/LE x 7 min 10 x STS, then x 10 with 10# x 10  Plank on elbows and knees 20 sec x 2 , then for 60 sec  Bridge 60 sec  Bridge 10 sec x 10 with 10 # weight  Ball squeeze x 10 Bridge with ball squeeze x 10  SKTC x 5 each , 10 sec  Mini crunch 10 x 2 Sidelying   hip abduction x15 each Side Clam x 15  Figure 4 push and pull 90/90 ab bracing  LTR    PATIENT EDUCATION:  Education details:Eval findings, POC, HEP, self care  Person educated: Patient Education method: Explanation, Demonstration, Tactile cues, Verbal cues, and Handouts Education comprehension: verbalized understanding, returned demonstration, verbal cues required, and tactile cues required     HOME EXERCISE PROGRAM: Access Code: [email or web address] URL: [email or web address] Date: 09/02/2022 Prepared by: Gar Ponto  Exercises - Supine Bridge  - 2 x daily - 7 x weekly - 1 sets - 10 reps - 10 hold - Supine Posterior Pelvic Tilt  - 2 x daily - 7 x weekly - 1 sets - 10 reps - 3 hold - Supine 90/90 Abdominal Bracing  - 1 x daily - 7 x weekly - 1 sets - 5 reps - 10 hold - Hooklying Clamshell with Resistance  - 2 x daily - 7 x weekly - 1 sets - 10 reps - 3 hold - Hooklying Single Knee to Chest  - 1 x daily - 7 x weekly - 1 sets - 3 reps - 20 hold - Supine Lower Trunk Rotation  - 1 x daily - 7 x weekly - 1 sets - 5 reps - 5 hold - Hooklying Hamstring Stretch with Strap  - 1 x daily - 7 x weekly - 3 sets - 3 reps - 20 hold Added -- Clam with Resistance  - 1 x daily - 7 x weekly - 2 sets - 10 reps - Sidelying Hip Abduction  - 1 x daily - 7 x weekly - 2 sets - 10 reps - Standard Plank  - 1 x daily - 7 x weekly - 1 sets - 5 reps - 10 hold   ASSESSMENT:   CLINICAL IMPRESSION: PT was completed for lumbopelvic flexibility and strengthening with progressive demand for strengthening.  5xSTS and Mod Oswestry were reassessed with both the pt's perceived LOF and functional test making progress and meeting these goals. Pt tolerated PT today without adverse effects. Pt will continue to benefit from skilled PT to address impairments for improved function with better tolerance.  OBJECTIVE IMPAIRMENTS decreased activity tolerance, difficulty walking, decreased ROM, decreased strength, impaired  flexibility, obesity, and pain.    ACTIVITY LIMITATIONS carrying, lifting, bending, sitting, standing, squatting, sleeping, stairs, locomotion level, and caring for others   PARTICIPATION LIMITATIONS: meal prep, cleaning, laundry, driving, shopping, community activity, and yard work   [place], Past/current experiences, Time since onset  of injury/illness/exacerbation, and 1 comorbidity: DM2  are also affecting patient's functional outcome.       GOALS:   SHORT TERM GOALS: Target date: 09/17/2022   Pt will be Ind in an initial HEP  Baseline:started Goal status: MET   2.  Pt will voice understanding of measures to assist in pain reduction  Baseline:  Status; uses HEP, stays active, takes tylenol occasionally Goal status:MET 09/17/22     LONG TERM GOALS: Target date: 10/17/22   Pt will be Ind in a final HEP to maintain achieved LOF  Baseline: started Goal status: ONGOING   2.  Increase bilal knee strength to 5/5 and hip to 4+/5 for improved trunk support and function Baseline: see flow sheet Status: 09/15/22: (knees 5/5) Goal status: PARTIALLY MET   3.  Pt will be able to complete a bridge and a plank from his knees for 60" each for improved trunk strength and function Baseline: Bridge = 20" Status: 09/11/22: able to hold plank on knees for 60 sec , and Bridge for 60 sec  Goal status: MET   4.  Improve 5xSTS by MCID of 5" and 2MWT by MCID of 47f as indication of improved functional mobility  Baseline: 5xSTS 15.7 "; 2MWT [id number] at normative standard Status: 8.9." from standard chair Goal status: MET   5.  Pt will report a decrease in LBP and L LE pain to 3-6/10 with daily activities for improved function and QOL Baseline: 5-9/10 Status: 09/15/22: has increased pain in the mornings intermittently up to 7/10 Goal status: ONGOING   PLAN: PT FREQUENCY: 2x/week   PT DURATION: 6 weeks   PLANNED INTERVENTIONS: Therapeutic exercises, Therapeutic activity, Balance  training, Gait training, Patient/Family education, Self Care, Joint mobilization, Aquatic Therapy, Dry Needling, Cryotherapy, Moist heat, Taping, Ultrasound, Manual therapy, and Re-evaluation.   PLAN FOR NEXT SESSION: Assess response to HEP; progress therex as indicated; use of modalities, manual therapy; and TPDN as indicated. Wiil assess goals and fine tune HEP to progress pt toward DC from OPPT.  Momina Hunton MS, PT 09/24/22 10:30 AM

## 2022-09-29 ENCOUNTER — Ambulatory Visit: Payer: Medicaid Other | Admitting: Physical Therapy

## 2022-09-29 ENCOUNTER — Encounter: Payer: Self-pay | Admitting: Physical Therapy

## 2022-09-29 DIAGNOSIS — M6281 Muscle weakness (generalized): Secondary | ICD-10-CM

## 2022-09-29 DIAGNOSIS — R262 Difficulty in walking, not elsewhere classified: Secondary | ICD-10-CM

## 2022-09-29 DIAGNOSIS — M5442 Lumbago with sciatica, left side: Secondary | ICD-10-CM | POA: Diagnosis not present

## 2022-09-29 DIAGNOSIS — G8929 Other chronic pain: Secondary | ICD-10-CM

## 2022-09-29 NOTE — Therapy (Signed)
OUTPATIENT PHYSICAL THERAPY TREATMENT NOTE   Patient Name: Parker Harvey MRN: [id number] DOB:Aug 30, 1961, 61 y.o., male Today's Date: 09/29/2022  PCP: Mack Hook, MD REFERRING PROVIDER: Mack Hook, MD  END OF SESSION:   PT End of Session - 09/29/22 0922     Visit Number 11    Number of Visits 13    Date for PT Re-Evaluation 10/17/22    Authorization Type [place] MEDICAID WELL[employer] Approved 10 PT visits from 09/02/2022 - 11/01/2022    Authorization - Visit Number 9    Authorization - Number of Visits 10    PT Start Time 0925    PT Stop Time 1005    PT Time Calculation (min) 40 min                 Past Medical History:  Diagnosis Date   Chronic low back pain 2010   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  Previously followed by Dr. Brien Few for chronic pain.  Epidural injections help, but pt. concernd with having too many and they hurt.   Dental decay    Diabetes type 2, controlled ([place])    Hyperlipidemia 2017   Presbyopia    Past Surgical History:  Procedure Laterality Date   BUNIONECTOMY Left 2005   [place]   low back surgery  2012   Dr. Saintclair Halsted:  L4-5 and L5-S1 decompression and stabilization surgery   Patient Active Problem List   Diagnosis Date Noted   Decreased hearing 01/13/2022   Elevated liver enzymes 12/13/2019   Ingrown right greater toenail 12/13/2019   Diabetes type 2, controlled ([place])    Insomnia 01/18/2016   Onychomycosis 01/18/2016   Mixed hyperlipidemia 12/16/2015   Dental decay 12/03/2015   Presbyopia 12/03/2015   Chronic back pain 08/18/2013    REFERRING DIAG: M54.9 (ICD-10-CM) - Acute left-sided back pain, unspecified back location  THERAPY DIAG:  Chronic bilateral low back pain with left-sided sciatica  Difficulty in walking, not elsewhere classified  Muscle weakness (generalized)  Rationale for Evaluation and Treatment Rehabilitation  SUBJECTIVE:                                                                                                                                                                                             SUBJECTIVE STATEMENT: Pt reports new onset of right wrist pain and weakness. No back pain right now. I was having back pain last night. Morning pain is better -goes away after AM meds. Has been a 4/10 lately.    PERTINENT HISTORY:  2012: L4-5 and L5-S1 decompression and stabilization surgery, DM2   PAIN:  Are you having pain? Yes: NPRS scale: 0/10, Pain location: low back and down L LE to knee Pain description: ache Aggravating factors: Prolonged standing and sitting, bending forward, lifting Relieving factors: Walking, medication, back brace    PRECAUTIONS: None   WEIGHT BEARING RESTRICTIONS No   PATIENT GOALS: To function with less pain. 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   PATIENT SURVEYS:  Modified Oswestry 27/50, 54%; 09/24/22= 18/36%   SCREENING FOR RED FLAGS: Bowel or bladder incontinence: No Cauda equina syndrome: No   COGNITION:           Overall cognitive status: Within functional limits for tasks assessed                          SENSATION: WFL   MUSCLE LENGTH: Hamstrings: Right WNLs deg; Left WNLs deg Marcello Moores test: Right WNLs deg; Left WNLs deg   POSTURE: rounded shoulders, forward head, and flexed trunk    PALPATION: TTP to the low back paraspinals and lumbar vertebrae   LUMBAR ROM:    Active  AROM  eval  Flexion Mod limited c min LBP  Extension Mod limited c mod LBP  Right lateral flexion Min limited c no LBP  Left lateral flexion Min limited c mod LBP  Right rotation Full c no LBP  Left rotation Min  limited c mod LBP   (Blank rows = not tested)   LOWER EXTREMITY ROM:                          Bilat hip ROM was WFLs and hip pain was not provoked, but LBP was provoked Active  Right eval Left eval  Hip flexion      Hip extension      Hip abduction      Hip adduction      Hip internal rotation      Hip external rotation      Knee flexion      Knee extension      Ankle dorsiflexion      Ankle plantarflexion      Ankle inversion      Ankle eversion       (Blank rows = not tested)   LOWER EXTREMITY MMT:                         Bridging 20 sec MMT Right eval Left eval Right Left Left 09/29/22  Hip flexion 4 4 4+/5 4+/5   Hip extension 4 4     Hip abduction _0 pain in back   Hip adduction         Hip internal rotation         Hip external rotation 4 4     Knee flexion 4+ 4+ 5 5   Knee extension 4+ 4+ 5 5   Ankle dorsiflexion         Ankle plantarflexion         Ankle inversion         Ankle eversion          (Blank rows = not tested)   LUMBAR SPECIAL TESTS:  Straight leg  raise test: Negative and Slump test: Negative   FUNCTIONAL TESTS:  5 times sit to stand:  09/02/22=15.7" bariatric chair; 09/24/22= 8.9" from  2 minute walk test:  09/02/22=636 ft   GAIT: Distance walked: 273f Assistive device utilized: Single point cane Level of assistance: Modified independence Comments: Walks c SPC most of the time. Within the clinic for a short distance, gait pattern did not change c the S[facility]. [facility] WNL        TODAY'S TREATMENT  [facility] Adult PT Treatment:                                                DATE: 09/29/22 Therapeutic Exercise: Nustep L5 LE only x 10 Single leg bridge x 20 each, 10 sec  LTR x 5 each  90/90 ab bracing, x10 10 sec  LTR with ball under knees x 10  Bridge with ball x 10 H/s curls with feet on ball/ ab brace Bridge with feet on ball Supine hamstring stretch x 2 each  Side hip abduction 3 x 10 each Figure 4 push  and pull x2 20"   [facility] Adult PT Treatment:                                                DATE: 09/24/22 Therapeutic Exercise: Nustep L7 UE/LE x 7 min Plank on elbows and toes x10 10 sec,  Childs pose and laterals Bridge 10 sec 2 x 10 with 10 # weight - and blue band clam Single leg bridge x 10 each, 10 sec  90/90 ab bracing, x10 10 sec  Supine clam blue band x 20 each Figure 4 push and pull x2 20" LTR x5 Piriformis stretch x2 20" Cybex leg press 2x10 80#  Self Care: Mod Oswestry completed and reviewed with pt.  [facility]                                                [facility]: 09[facility]22 Therapeutic Exercise: Nustep L5 UE/ LE x 7 Plank on elbows and toes 20 sec , 10 sec, 20 sec Childs pose and laterals Single leg bridge x 10 each, 10 sec  90/90 ab bracing, x10 10 sec  Dead bug  SIde hip abduction  10 x 2 each Side clam blue band x 20 each Figure 4 push and pull x2 20" LTR x5 STS with 20# 2x15   [facility] Adult PT Treatment:                                                DATE: 09/17/22 Therapeutic Exercise: Nustep L6 UE/LE x 7 min STS with 20# 2x15 Plank on elbows and toes 10 sec x 5  Childs pose and laterals banded side steps 270f8 GTB Bridge 10 sec 2 x 10 with 10 # weight - and red band clam Single leg bridge x 10 each, 10 sec  Dead  Bug x15, verbal cueing for proper completion (supine march with arms) SKTC x  3 each , 10 sec  Figure 4 push and pull x2 20" LTR x5  [facility] Adult PT Treatment:                                                DATE: 09/11/22 Therapeutic Exercise: Nustep L5 UE/LE x 7 min STS with 15# x 15 Plank on elbows and toes 10 sec x 5  Childs pose  and laterals Side hip abduction 10 x 2  Side red band clam 10 x 2  Bridge 10 sec 2 x 10 with 10 # weight - and red band clam Single leg bridge x 10 each, 10 sec  90/90 ab bracing,  10 sec  SKTC x 5 each , 10 sec  Figure 4 push and pull LTR   [facility] Adult PT Treatment:                                                 DATE: 09/11/22 Therapeutic Exercise: Nustep L5 UE/LE x 7 min 10 x STS, then x 10 with 10# x 10  Plank on elbows and knees 20 sec x 2 , then for 60 sec  Bridge 60 sec  Bridge 10 sec x 10 with 10 # weight  Ball squeeze x 10 Bridge with ball squeeze x 10  SKTC x 5 each , 10 sec  Mini crunch 10 x 2 Sidelying  hip abduction x15 each Side Clam x 15  Figure 4 push and pull 90/90 ab bracing  LTR    PATIENT EDUCATION:  Education details:Eval findings, POC, HEP, self care  Person educated: Patient Education method: Explanation, Demonstration, Tactile cues, Verbal cues, and Handouts Education comprehension: verbalized understanding, returned demonstration, verbal cues required, and tactile cues required     HOME EXERCISE PROGRAM: Access Code: [email or web address] URL: [email or web address] Date: 09/02/2022 Prepared by: Gar Ponto  Exercises - Supine Bridge  - 2 x daily - 7 x weekly - 1 sets - 10 reps - 10 hold - Supine Posterior Pelvic Tilt  - 2 x daily - 7 x weekly - 1 sets - 10 reps - 3 hold - Supine 90/90 Abdominal Bracing  - 1 x daily - 7 x weekly - 1 sets - 5 reps - 10 hold - Hooklying Clamshell with Resistance  - 2 x daily - 7 x weekly - 1 sets - 10 reps - 3 hold - Hooklying Single Knee to Chest  - 1 x daily - 7 x weekly - 1 sets - 3 reps - 20 hold - Supine Lower Trunk Rotation  - 1 x daily - 7 x weekly - 1 sets - 5 reps - 5 hold - Hooklying Hamstring Stretch with Strap  - 1 x daily - 7 x weekly - 3 sets - 3 reps - 20 hold Added -- Clam with Resistance  - 1 x daily - 7 x weekly - 2 sets - 10 reps - Sidelying Hip Abduction  - 1 x daily - 7 x weekly - 2 sets - 10 reps - Standard Plank  - 1 x daily - 7 x weekly - 1 sets - 5 reps - 10  hold   ASSESSMENT:   CLINICAL IMPRESSION: Pt reports he is better 8% better as far as his back pain and feels stronger in his abdominal and LE muscles. He does report a lower intensity of pain with ADLs and his morning pain  has been 4/10 or less lately which meets LTG#5. He had back pain with hip abduction testing on the left. Due to acute wrist pain, did not perform therex which placed pressure on wrists. Pt tolerated PT today without adverse effects. Pt will continue to benefit from skilled PT to address impairments for improved function with better tolerance.  OBJECTIVE IMPAIRMENTS decreased activity tolerance, difficulty walking, decreased ROM, decreased strength, impaired flexibility, obesity, and pain.    ACTIVITY LIMITATIONS carrying, lifting, bending, sitting, standing, squatting, sleeping, stairs, locomotion level, and caring for others   PARTICIPATION LIMITATIONS: meal prep, cleaning, laundry, driving, shopping, community activity, and yard work   [place], Past/current experiences, Time since onset of injury/illness/exacerbation, and 1 comorbidity: DM2  are also affecting patient's functional outcome.       GOALS:   SHORT TERM GOALS: Target date: 09/17/2022   Pt will be Ind in an initial HEP  Baseline:started Goal status: MET   2.  Pt will voice understanding of measures to assist in pain reduction  Baseline:  Status; uses HEP, stays active, takes tylenol occasionally Goal status:MET 09/17/22     LONG TERM GOALS: Target date: 10/17/22   Pt will be Ind in a final HEP to maintain achieved LOF  Baseline: started Goal status: ONGOING   2.  Increase bilal knee strength to 5/5 and hip to 4+/5 for improved trunk support and function Baseline: see flow sheet Status: 09/15/22: (knees 5/5) Goal status: PARTIALLY MET   3.  Pt will be able to complete a bridge and a plank from his knees for 60" each for improved trunk strength and function Baseline: Bridge = 20" Status: 09/11/22: able to hold plank on knees for 60 sec , and Bridge for 60 sec  Goal status: MET   4.  Improve 5xSTS by MCID of 5" and 2MWT by MCID of 78f as indication of improved functional mobility  Baseline: 5xSTS 15.7  "; 2MWT 6369f at normative standard Status: 8.9." from standard chair Goal status: MET   5.  Pt will report a decrease in LBP and L LE pain to 3-6/10 with daily activities for improved function and QOL Baseline: 5-9/10 Status: 09/15/22: has increased pain in the mornings intermittently up to 7/10 Status: 09/29/22: pt reports 4/10 at highest in the mornings over last week.  Goal status:MET   PLAN: PT FREQUENCY: 2x/week   PT DURATION: 6 weeks   PLANNED INTERVENTIONS: Therapeutic exercises, Therapeutic activity, Balance training, Gait training, Patient/Family education, Self Care, Joint mobilization, Aquatic Therapy, Dry Needling, Cryotherapy, Moist heat, Taping, Ultrasound, Manual therapy, and Re-evaluation.   PLAN FOR NEXT SESSION: Wiil assess goals and fine tune HEP; DC next visit  JeHessie DienerPTA 09/29/22 10:22 AM Phone: [phone]: [phone]

## 2022-09-30 ENCOUNTER — Ambulatory Visit: Payer: Medicaid Other | Admitting: Physical Therapy

## 2022-10-04 ENCOUNTER — Other Ambulatory Visit: Payer: Self-pay | Admitting: Internal Medicine

## 2022-10-06 ENCOUNTER — Ambulatory Visit: Payer: Medicaid Other | Admitting: Physical Therapy

## 2022-10-07 NOTE — Therapy (Signed)
OUTPATIENT PHYSICAL THERAPY TREATMENT NOTE/Discharge   Patient Name: Parker Harvey MRN: [id number] DOB:1961/09/04, 61 y.o., male Today's Date: 10/08/2022  PCP: Mack Hook, MD REFERRING PROVIDER: Mack Hook, MD  END OF SESSION:   PT End of Session - 10/08/22 0854     Visit Number 12    Number of Visits 13    Date for PT Re-Evaluation 10/17/22    Authorization Type [place] MEDICAID WELLCARE-WellCare Approved 10 PT visits from 09/02/2022 - 11/01/2022    Authorization - Visit Number 10    Authorization - Number of Visits 10    PT Start Time 7654    PT Stop Time 0930    PT Time Calculation (min) 40 min    Equipment Utilized During Treatment Other (comment)   SPC   Behavior During Therapy [facility] for tasks assessed/performed                  Past Medical History:  Diagnosis Date   Chronic low back pain 2010   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  Previously followed by Dr. Brien Few for chronic pain.  Epidural injections help, but pt. concernd with having too many and they hurt.   Dental decay    Diabetes type 2, controlled ([place])    Hyperlipidemia 2017   Presbyopia    Past Surgical History:  Procedure Laterality Date   BUNIONECTOMY Left 2005   [place]   low back surgery  2012   Dr. Saintclair Halsted:  L4-5 and L5-S1 decompression and stabilization surgery   Patient Active Problem List   Diagnosis Date Noted   Decreased hearing 01/13/2022   Elevated liver enzymes 12/13/2019   Ingrown right greater toenail 12/13/2019   Diabetes type 2, controlled ([place])    Insomnia 01/18/2016   Onychomycosis 01/18/2016   Mixed hyperlipidemia 12/16/2015   Dental decay 12/03/2015   Presbyopia 12/03/2015   Chronic back pain 08/18/2013    REFERRING DIAG: M54.9 (ICD-10-CM) - Acute left-sided back pain, unspecified back location  THERAPY DIAG:  Chronic bilateral low back pain with left-sided sciatica  Difficulty in walking, not elsewhere  classified  Muscle weakness (generalized)  Rationale for Evaluation and Treatment Rehabilitation  SUBJECTIVE:                                                                                                                                                                                            SUBJECTIVE STATEMENT: Pt reports he is consistent with hsi HEP. Overall, his pain has been lower, more often. 0-6/10 pain range in the past week.   PERTINENT HISTORY:  2012: L4-5 and L5-S1  decompression and stabilization surgery, DM2   PAIN:  Are you having pain? Yes: NPRS scale: 5/10, Pain location: low back and down L LE to knee Pain description: ache Aggravating factors: Prolonged standing and sitting, bending forward, lifting Relieving factors: Walking, medication, back brace    PRECAUTIONS: None   WEIGHT BEARING RESTRICTIONS No   PATIENT GOALS: To function with less pain. 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   PATIENT SURVEYS:  Modified Oswestry 27/50, 54%; 09/24/22= 18/36%   SCREENING FOR RED FLAGS: Bowel or bladder incontinence: No Cauda equina syndrome: No   COGNITION:           Overall cognitive status: Within functional limits for tasks assessed                          SENSATION: WFL   MUSCLE LENGTH: Hamstrings: Right WNLs deg; Left WNLs deg Marcello Moores test: Right WNLs deg; Left WNLs deg   POSTURE: rounded shoulders, forward head, and flexed trunk    PALPATION: TTP to the low back paraspinals and lumbar vertebrae   LUMBAR ROM:    Active  AROM  eval  Flexion Mod limited c min LBP  Extension Mod limited c mod LBP  Right lateral flexion Min limited c no LBP  Left lateral flexion  Min limited c mod LBP  Right rotation Full c no LBP  Left rotation Min limited c mod LBP   (Blank rows = not tested)   LOWER EXTREMITY ROM:                          Bilat hip ROM was WFLs and hip pain was not provoked, but LBP was provoked Active  Right eval Left eval  Hip flexion      Hip extension      Hip abduction      Hip adduction      Hip internal rotation      Hip external rotation      Knee flexion      Knee extension      Ankle dorsiflexion      Ankle plantarflexion      Ankle inversion      Ankle eversion       (Blank rows = not tested)   LOWER EXTREMITY MMT:                         Bridging 20 sec MMT Right eval Left eval Right Left Left 09/29/22 Rt 10/08/22 LT 10/08/22  Hip flexion 4 4 4+/5 4+/5  5 4+  Hip extension _0 Hip abduction _1 pain in back  5 4+  Hip adduction        5 5  Hip internal rotation        5 5  Hip external rotation _2 4+  Knee flexion 4+ 4+ 5 5     Knee extension 4+ 4+ 5 5     Ankle dorsiflexion           Ankle plantarflexion           Ankle  inversion           Ankle eversion            (Blank rows = not tested)   LUMBAR SPECIAL TESTS:  Straight leg raise test: Negative and Slump test: Negative   FUNCTIONAL TESTS:  5 times sit to stand:  09/02/22=15.7" bariatric chair; 09/24/22= 8.9" from  2 minute walk test:  09/02/22=636 ft   GAIT: Distance walked: 259f Assistive device utilized: Single point cane Level of assistance: Modified independence Comments: Walks c SPC most of the time. Within the clinic for a short distance, gait pattern did not change c [facility] WNL        TODAY'S TREATMENT  [facility] Adult PT Treatment:                                                DATE: 10/08/22 Therapeutic Exercise: Nustep L5 LE only x 10 Single leg bridge x 20 each, 10 sec  LTR x 5 each  90/90 ab bracing, x10 10 sec  Plank on elbows and toes x10 10 sec LTR with ball under knees x 10   Bridge with ball x 10 5" Hip abd BluTB x10 Side hip abduction x10 each Figure 4 push and pull x2 20" MMT  Final HEP  [facility] Adult PT Treatment:                                                DATE: 09/29/22 Therapeutic Exercise: Nustep L5 LE only x 10 Single leg bridge x 20 each, 10 sec  LTR x 5 each  90/90 ab bracing, x10 10 sec  Plank on elbows and toes x10 10 sec,  LTR with ball under knees x 10  Bridge with ball x 10 H/s curls with feet on ball/ ab brace Bridge with feet on ball Supine hamstring stretch x 2 each  Side hip abduction 3 x 10 each Figure 4 push and pull x2 20"   [facility] Adult PT Treatment:                                                DATE: 09/24/22 Therapeutic Exercise: Nustep L7 UE/LE x 7 min Plank on elbows and toes x10 10 sec,  Childs pose and laterals Bridge 10 sec 2 x 10 with 10 # weight - and blue band clam Single leg bridge x 10 each, 10 sec  90/90 ab bracing, x10 10 sec  Supine clam blue band x 20 each Figure 4 push and pull x2 20" LTR x5 Piriformis stretch x2 20" Cybex leg press 2x10 80#  Self Care: Mod Oswestry completed and reviewed with pt.   PATIENT EDUCATION:  Education details:Eval findings, POC, HEP, self care  Person educated: Patient Education method: Explanation, Demonstration, Tactile cues, Verbal cues, and Handouts Education comprehension: verbalized understanding, returned demonstration, verbal cues required, and tactile cues required     HOME EXERCISE PROGRAM: Access Code: [email or web address]: [email or web address] Date: 10/08/2022 Prepared by: AGar Ponto Exercises - Supine Bridge  - 1 x  daily - 7 x weekly - 1 sets - 10 reps - 10 hold - Supine Posterior Pelvic Tilt  - 1 x daily - 7 x weekly - 1 sets - 10 reps - 3 hold - Supine 90/90 Abdominal Bracing  - 1 x daily - 7 x weekly - 1 sets - 5 reps - 10 hold - Hooklying Clamshell with Resistance  - 1 x daily - 7 x weekly - 1 sets - 10 reps - 3 hold - Clam with  Resistance  - 1 x daily - 7 x weekly - 1 sets - 10 reps - Sidelying Hip Abduction  - 1 x daily - 7 x weekly - 1 sets - 10 reps - Standard Plank  - 1 x daily - 7 x weekly - 1 sets - 5 reps - 10 hold - Single Leg Bridge  - 1 x daily - 7 x weekly - 1 sets - 10 reps - Hooklying Single Knee to Chest  - 1 x daily - 7 x weekly - 1 sets - 3 reps - 20 hold - Supine Lower Trunk Rotation  - 1 x daily - 7 x weekly - 1 sets - 5 reps - 5 hold - Hooklying Hamstring Stretch with Strap  - 1 x daily - 7 x weekly - 1 sets - 3 reps - 20 hold - Supine Piriformis Stretch with Foot on Ground  - 1 x daily - 7 x weekly - 1 sets - 3 reps - 20 hold   ASSESSMENT:   CLINICAL IMPRESSION: Pt completed is last PT session today. Pt has made good progress in PT re: function, strength, and pain meeting all set goals. Pt is in agreement with DC and he is Ind in a HEP to maintain the achieve LOF.   OBJECTIVE IMPAIRMENTS decreased activity tolerance, difficulty walking, decreased ROM, decreased strength, impaired flexibility, obesity, and pain.    ACTIVITY LIMITATIONS carrying, lifting, bending, sitting, standing, squatting, sleeping, stairs, locomotion level, and caring for others   PARTICIPATION LIMITATIONS: meal prep, cleaning, laundry, driving, shopping, community activity, and yard work   [place], Past/current experiences, Time since onset of injury/illness/exacerbation, and 1 comorbidity: DM2  are also affecting patient's functional outcome.       GOALS:   SHORT TERM GOALS: Target date: 09/17/2022   Pt will be Ind in an initial HEP  Baseline:started Goal status: MET   2.  Pt will voice understanding of measures to assist in pain reduction  Baseline:  Status; uses HEP, stays active, takes tylenol occasionally Goal status:MET 09/17/22     LONG TERM GOALS: Target date: 10/17/22   Pt will be Ind in a final HEP to maintain achieved LOF  Baseline: started Goal status: MET   2.  Increase bilal knee  strength to 5/5 and hip to 4+/5 for improved trunk support and function Baseline: see flow sheet Status: 09/15/22: (knees 5/5) 10/08/22= see flow sheets hip strength improved Goal status: MET   3.  Pt will be able to complete a bridge and a plank from his knees for 60" each for improved trunk strength and function Baseline: Bridge = 20" Status: 09/11/22: able to hold plank on knees for 60 sec , and Bridge for 60 sec  Goal status: MET   4.  Improve 5xSTS by MCID of 5" and 2MWT by MCID of 14f as indication of improved functional mobility  Baseline: 5xSTS 15.7 "; 2MWT 6391f at normative standard Status: 8.9." from standard chair Goal  status: MET   5.  Pt will report a decrease in LBP and L LE pain to 3-6/10 with daily activities for improved function and QOL Baseline: 5-9/10 Status: 09/15/22: has increased pain in the mornings intermittently up to 7/10 Status: 09/29/22: pt reports 4/10 at highest in the mornings over last week.  Goal status:MET   PLAN: PT FREQUENCY: 2x/week   PT DURATION: 6 weeks   PLANNED INTERVENTIONS: Therapeutic exercises, Therapeutic activity, Balance training, Gait training, Patient/Family education, Self Care, Joint mobilization, Aquatic Therapy, Dry Needling, Cryotherapy, Moist heat, Taping, Ultrasound, Manual therapy, and Re-evaluation.   PLAN FOR NEXT SESSION: Wiil assess goals and fine tune HEP; DC next visit  PHYSICAL THERAPY DISCHARGE SUMMARY  Visits from Start of Care: 12  Current functional level related to goals / functional outcomes: See clinical impression and PT goals    Remaining deficits: See clinical impression and PT goals    Education / Equipment: HEP   Patient agrees to discharge. Patient goals were met. Patient is being discharged due to being pleased with the current functional level.   Damali Broadfoot MS, PT 10/08/22 1:31 PM

## 2022-10-08 ENCOUNTER — Ambulatory Visit: Payer: Medicaid Other

## 2022-10-08 DIAGNOSIS — M6281 Muscle weakness (generalized): Secondary | ICD-10-CM

## 2022-10-08 DIAGNOSIS — G8929 Other chronic pain: Secondary | ICD-10-CM

## 2022-10-08 DIAGNOSIS — M5442 Lumbago with sciatica, left side: Secondary | ICD-10-CM | POA: Diagnosis not present

## 2022-10-08 DIAGNOSIS — R262 Difficulty in walking, not elsewhere classified: Secondary | ICD-10-CM

## 2022-12-11 ENCOUNTER — Other Ambulatory Visit: Payer: Self-pay

## 2022-12-11 MED ORDER — GABAPENTIN 300 MG PO CAPS: 300.0000 mg | ORAL_CAPSULE | Freq: Two times a day (BID) | ORAL | 6 refills | Status: DC

## 2022-12-23 ENCOUNTER — Ambulatory Visit (INDEPENDENT_AMBULATORY_CARE_PROVIDER_SITE_OTHER): Payer: Medicaid Other | Admitting: Internal Medicine

## 2022-12-23 DIAGNOSIS — Z23 Encounter for immunization: Secondary | ICD-10-CM | POA: Diagnosis not present

## 2023-01-08 ENCOUNTER — Other Ambulatory Visit: Payer: Self-pay

## 2023-01-08 MED ORDER — ATORVASTATIN CALCIUM 80 MG PO TABS: 40.0000 mg | ORAL_TABLET | Freq: Every evening | ORAL | 6 refills | Status: DC

## 2023-01-15 ENCOUNTER — Other Ambulatory Visit (INDEPENDENT_AMBULATORY_CARE_PROVIDER_SITE_OTHER): Payer: Medicaid Other

## 2023-01-15 DIAGNOSIS — E119 Type 2 diabetes mellitus without complications: Secondary | ICD-10-CM

## 2023-01-15 DIAGNOSIS — Z0000 Encounter for general adult medical examination without abnormal findings: Secondary | ICD-10-CM

## 2023-01-15 DIAGNOSIS — E782 Mixed hyperlipidemia: Secondary | ICD-10-CM | POA: Diagnosis not present

## 2023-01-15 DIAGNOSIS — Z79899 Other long term (current) drug therapy: Secondary | ICD-10-CM

## 2023-01-16 LAB — CBC WITH DIFFERENTIAL/PLATELET
Basophils Absolute: 0 10*3/uL (ref 0.0–0.2)
Basos: 0 %
EOS (ABSOLUTE): 0.1 10*3/uL (ref 0.0–0.4)
Eos: 2 %
Hematocrit: 40 % (ref 37.5–51.0)
Hemoglobin: 12.9 g/dL — ABNORMAL LOW (ref 13.0–17.7)
Immature Grans (Abs): 0 10*3/uL (ref 0.0–0.1)
Immature Granulocytes: 0 %
Lymphocytes Absolute: 2.8 10*3/uL (ref 0.7–3.1)
Lymphs: 42 %
MCH: 28.5 pg (ref 26.6–33.0)
MCHC: 32.3 g/dL (ref 31.5–35.7)
MCV: 88 fL (ref 79–97)
Monocytes Absolute: 0.6 10*3/uL (ref 0.1–0.9)
Monocytes: 9 %
Neutrophils Absolute: 3.1 10*3/uL (ref 1.4–7.0)
Neutrophils: 47 %
Platelets: 257 10*3/uL (ref 150–450)
RBC: 4.53 x10E6/uL (ref 4.14–5.80)
RDW: 13 % (ref 11.6–15.4)
WBC: 6.6 10*3/uL (ref 3.4–10.8)

## 2023-01-16 LAB — LIPID PANEL W/O CHOL/HDL RATIO
Cholesterol, Total: 107 mg/dL (ref 100–199)
HDL: 42 mg/dL (ref >39)
LDL Chol Calc (NIH): 52 mg/dL (ref 0–99)
Triglycerides: 59 mg/dL (ref 0–149)
VLDL Cholesterol Cal: 13 mg/dL (ref 5–40)

## 2023-01-16 LAB — HEMOGLOBIN A1C
Est. average glucose Bld gHb Est-mCnc: 143 mg/dL
Hgb A1c MFr Bld: 6.6 % — ABNORMAL HIGH (ref 4.8–5.6)

## 2023-01-16 LAB — COMPREHENSIVE METABOLIC PANEL
ALT: 26 IU/L (ref 0–44)
AST: 24 IU/L (ref 0–40)
Albumin/Globulin Ratio: 1.8 (ref 1.2–2.2)
Albumin: 4.6 g/dL (ref 3.8–4.9)
Alkaline Phosphatase: 78 IU/L (ref 44–121)
BUN/Creatinine Ratio: 10 (ref 9–20)
BUN: 13 mg/dL (ref 6–24)
Bilirubin Total: 0.6 mg/dL (ref 0.0–1.2)
CO2: 24 mmol/L (ref 20–29)
Calcium: 10.4 mg/dL — ABNORMAL HIGH (ref 8.7–10.2)
Chloride: 102 mmol/L (ref 96–106)
Creatinine, Ser: 1.34 mg/dL — ABNORMAL HIGH (ref 0.76–1.27)
Globulin, Total: 2.6 g/dL (ref 1.5–4.5)
Glucose: 112 mg/dL — ABNORMAL HIGH (ref 70–99)
Potassium: 4.5 mmol/L (ref 3.5–5.2)
Sodium: 143 mmol/L (ref 134–144)
Total Protein: 7.2 g/dL (ref 6.0–8.5)
eGFR: 61 mL/min/{1.73_m2} (ref >59)

## 2023-01-16 LAB — MICROALBUMIN / CREATININE URINE RATIO

## 2023-01-16 LAB — PSA: Prostate Specific Ag, Serum: 0.8 ng/mL (ref 0.0–4.0)

## 2023-01-19 ENCOUNTER — Ambulatory Visit (INDEPENDENT_AMBULATORY_CARE_PROVIDER_SITE_OTHER): Payer: Medicaid Other | Admitting: Internal Medicine

## 2023-01-19 ENCOUNTER — Encounter: Payer: Self-pay | Admitting: Internal Medicine

## 2023-01-19 VITALS — BP 132/88 | HR 80 | Resp 16 | Ht 63.0 in | Wt 163.0 lb

## 2023-01-19 DIAGNOSIS — E119 Type 2 diabetes mellitus without complications: Secondary | ICD-10-CM | POA: Diagnosis not present

## 2023-01-19 DIAGNOSIS — E782 Mixed hyperlipidemia: Secondary | ICD-10-CM

## 2023-01-19 DIAGNOSIS — K429 Umbilical hernia without obstruction or gangrene: Secondary | ICD-10-CM

## 2023-01-19 DIAGNOSIS — D649 Anemia, unspecified: Secondary | ICD-10-CM

## 2023-01-19 DIAGNOSIS — Z0000 Encounter for general adult medical examination without abnormal findings: Secondary | ICD-10-CM | POA: Diagnosis not present

## 2023-01-19 DIAGNOSIS — H9190 Unspecified hearing loss, unspecified ear: Secondary | ICD-10-CM

## 2023-01-19 DIAGNOSIS — G4700 Insomnia, unspecified: Secondary | ICD-10-CM

## 2023-01-19 NOTE — Patient Instructions (Addendum)
Warm bath and reading before bed. If unable to get to sleep in 20 minutes, get out of bed, go somewhere calm and read until you are sleepy, then back to bed. If awaken and unable to get back to sleep in 20 minutes--read as above until sleepy. Physical activity with a friend every day--gradually work up to 1 hour.  Do not exercise close to bedtime. No napping  [facility] [address] [place],  [address] Phone:  [phone]

## 2023-01-19 NOTE — Progress Notes (Signed)
Subjective:    Patient ID: Parker Harvey, male   DOB: 1961-06-02, 62 y.o.   MRN: [id number]   HPI  Here for Male CPE:  1.  STE:  Does perform.  No family history of testicular cancer.    2.  PSA: Last 01/15/2023 and normal at 0.8.  No family history of prostate cancer.    3.  Guaiac Cards/FIT:  Last performed in 2020 as plans for colonoscopy the last 2 years that has not occurred.    4.  Colonoscopy: Last in 2010 and normal.  He cannot return to Dr. Lorie Apley office due to a very old bill.  States tried to set up payments, but was denied per patient.  He found this out last year when referred for colonoscopy  5.  Cholesterol/Glucose:  A1C remains in diabetic controlled range at 6.6%.  Cholesterol fine save for a decreased HDL.  Has increased weight and admits to going to sleep late as snoozing watching TV until late and not being physically active as much.    6.  Immunizations:  Has not had COVID booster for the year.   Immunization History  Administered Date(s) Administered   Influenza Inj Mdck Quad Pf 12/23/2022   Influenza,inj,Quad PF,6+ Mos 08/18/2013   Influenza-Unspecified 11/23/2015, 09/30/2018, 09/15/2019, 09/14/2021   Janssen (J&J) SARS-COV-2 Vaccination 03/21/2020   Moderna Covid-19 Vaccine Bivalent Booster 56yrs & up 01/13/2022   PFIZER Comirnaty(Gray Top)Covid-19 Tri-Sucrose Vaccine 08/05/2021   Pneumococcal Polysaccharide-23 06/10/2019   Td 12/13/2019   Tdap 12/15/2008   Zoster Recombinat (Shingrix) 04/16/2022, 07/14/2022     Current Meds  Medication Sig   atorvastatin (LIPITOR) 80 MG tablet Take 0.5 tablets (40 mg total) by mouth every evening.   cetirizine (ZYRTEC) 10 MG tablet TAKE 1 TABLET BY MOUTH EVERY DAY   Cholecalciferol (VITAMIN D3) 25 MCG (1000 UT) CAPS Take by mouth. 1 daily   gabapentin (NEURONTIN) 300 MG capsule Take 1 capsule (300 mg total) by mouth 2 (two) times daily.   metFORMIN (GLUCOPHAGE-XR) 500 MG 24 hr tablet TAKE 1 TABLET BY MOUTH TWICE  DAILY WITH MEALS   methocarbamol (ROBAXIN) 750 MG tablet 1-2 tabs by mouth every 6 hours as needed   Omega-3 Fatty Acids (FISH OIL BURP-LESS) 1000 MG CAPS 2 caps by mouth twice daily.   tiZANidine (ZANAFLEX) 4 MG tablet Take 1 tablet (4 mg total) by mouth every 8 (eight) hours as needed for muscle spasms.   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  Previously followed by Dr. Brien Few for chronic pain.  Epidural injections help, but pt. concernd with having too many and they hurt.   Dental decay    Diabetes type 2, controlled ([place])    Hyperlipidemia 2017   Presbyopia    Past Surgical History:  Procedure Laterality Date   BUNIONECTOMY Left 2005   [place]   low back surgery  2012   Dr. Saintclair Halsted:  L4-5 and L5-S1 decompression and stabilization surgery   Family History  Problem Relation Age of Onset   Hypertension Mother    Diabetes Mother    Heart disease Mother        MI cause of death  Heart disease Father 107       MI-cause of death   Hypertension Father    Stroke Father    Cancer Sister        Breast   Cancer Sister        Breast   Hypertension Brother    Hypertension Brother    Heart disease Brother        2 MIs and CABG   Social History   Socioeconomic History   Marital status: Married    Spouse name: Zephaniah Lubrano   Number of children: 3   Years of education: 10   Highest education level: Not on file  Occupational History   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  Bored and dips. Limited by back.  States can stop on own.  Continues to feel he can do this on own.    Vaping Use   Vaping Use: Never used  Substance and Sexual Activity   Alcohol use: Not Currently    Alcohol/week: 0.0 standard drinks of alcohol    Comment: stopped in 2020.   Drug use: No   Sexual activity: Yes    Comment: wife post menopausal  Other Topics Concern   Not on file  Social History Narrative   Lives off [address] now with wife.    Has 3 children he considers his own, but are biologically his wife's and her previous partner's   Unable to work due to chronic back issues.   Social Determinants of Health   Financial Resource Strain: Low Risk  (01/19/2023)   Overall Financial Resource Strain (CARDIA)    Difficulty of Paying Living Expenses: Not hard at all  Food Insecurity: No Food Insecurity (01/19/2023)   Hunger Vital Sign    Worried About Running Out of Food in the Last Year: Never true    Ran Out of Food in the Last Year: Never true  Transportation Needs: No Transportation Needs (01/19/2023)   PRAPARE - Administrator, Civil Service (Medical): No    Lack of Transportation (Non-Medical): No  Physical Activity: Insufficiently Active (06/10/2019)   Exercise Vital Sign    Days of Exercise per Week: 2 days    Minutes of Exercise per Session: 30 min  Stress: No Stress Concern Present (06/10/2019)   [employer] of Occupational Health - Occupational Stress Questionnaire    Feeling of Stress : Only a little  Social Connections: Unknown (06/10/2019)   Social Connection and Isolation Panel [NHANES]    Frequency of Communication with Friends and Family: More than three times a week    Frequency of Social Gatherings with Friends and Family: Never    Attends Religious Services: Never    Database administrator or Organizations: Not on file    Attends Tax inspector Meetings: Not on file    Marital Status: Married  Intimate Partner Violence: Not At Risk (01/19/2023)   Humiliation, Afraid, Rape, and Kick questionnaire    Fear of Current or Ex-Partner: No    Emotionally Abused: No    Physically Abused: No    Sexually Abused:  No     Review of Systems  HENT:  Positive for hearing loss (Did not go to [facility] after last CPE.  Does not have voicemail set up.  Is interested in hearing aides.).   Eyes:  Positive for visual disturbance (states cannot recall getting a call for eye referral.).  Respiratory:  Negative for shortness of breath.   Cardiovascular:  Negative for chest pain, palpitations and leg swelling.  Gastrointestinal:  Negative for abdominal pain and blood in stool (No melena.).  Genitourinary:  Negative for decreased urine volume and dysuria.  Musculoskeletal:        Low back still an issue      Objective:   BP 132/88 (BP Location: Left Arm, Patient Position: Sitting, Cuff Size: Normal)   Pulse 80   Resp 16   Ht 5\' 3"  (1.6 m)   Wt 163 lb (73.9 kg)   BMI 28.87 kg/m   Obvious difficulty hearing.  Physical Exam HENT:     Head: Normocephalic and atraumatic.     Right Ear: Tympanic membrane, ear canal and external ear normal.     Left Ear: Tympanic membrane, ear canal and external ear normal.     Nose: Nose normal.     Mouth/Throat:     Mouth: Mucous membranes are moist.     Pharynx: Oropharynx is clear.     Comments: Many missing teeth Eyes:     Extraocular Movements: Extraocular movements intact.     Conjunctiva/sclera: Conjunctivae normal.     Pupils: Pupils are equal, round, and reactive to light.     Comments: Discs sharp  Neck:     Thyroid: No thyroid mass or thyromegaly.  Cardiovascular:     Rate and Rhythm: Normal rate and regular rhythm.     Heart sounds: S1 normal and S2 normal. No murmur heard.    No friction rub. No S3 or S4 sounds.     Comments: No carotid bruits.  Carotid, radial, femoral, DP and PT  pulses normal and equal.   Pulmonary:     Effort: Pulmonary effort is normal.     Breath sounds: Normal breath sounds and air entry.  Abdominal:     General: Bowel sounds are normal.     Palpations: Abdomen is soft. There is no hepatomegaly, splenomegaly or mass.     Tenderness: There is no abdominal tenderness.     Hernia: A hernia (small umbilical hernia, reducible) is present.  Genitourinary:    Comments: Refused exam Musculoskeletal:        General: Normal range of motion.     Cervical back: Normal range of motion and neck supple.     Right lower leg: No edema.     Left lower leg: No edema.  Feet:     Right foot:     Protective Sensation: 10 sites tested.  10 sites sensed.     Skin integrity: Skin integrity normal.     Toenail Condition: Fungal disease present.    Left foot:     Protective Sensation: 10 sites tested.  10 sites sensed.     Skin integrity: Skin integrity normal.     Toenail Condition: Fungal disease present.    Comments: Great toenails with thickened discolored nails Lymphadenopathy:     Head:     Right side of head: No submental or submandibular adenopathy.     Left side of head: No submental or submandibular adenopathy.     Cervical: No cervical adenopathy.  Upper Body:     Right upper body: No supraclavicular adenopathy.     Left upper body: No supraclavicular adenopathy.     Lower Body: No right inguinal adenopathy. No left inguinal adenopathy.  Skin:    General: Skin is warm.     Capillary Refill: Capillary refill takes less than 2 seconds.     Findings: No rash.  Neurological:     General: No focal deficit present.     Mental Status: He is alert and oriented to person, place, and time.     Cranial Nerves: Cranial nerves 2-12 are intact.     Sensory: Sensation is intact.     Motor: Motor function is intact.     Coordination: Coordination is intact.     Gait: Gait is intact.     Deep Tendon Reflexes: Reflexes are normal and symmetric.   Psychiatric:        Speech: Speech normal.        Behavior: Behavior normal. Behavior is cooperative.      Assessment & Plan    CPE Encouraged COVID 2023-2024 vaccination. FIT to return in 2 weeks.  2.  Normocytic anemia:  due for colonoscopy and now with mild normocytic anemia:  RBC folic acid, A54, Iron studies, FIT.  Referral to [facility] as cannot afford to return to previous GI specialist.   3.  Hypertension:  fair control  4.  DM:  controlled.  Eye referral.  To set up voicemail on phone.  Urine Microalbumin/crea repeat as discarded at lab.    5.  Decreased hearing;  referral to [facility] again as he now recognizes need for amplification.  6.  Hyperlipidemia:  HDL down and weight up--he will work on daily physical activity and getting sleep hygiene improved so not up so late watching TV.

## 2023-01-22 LAB — FOLATE RBC
Folate, Hemolysate: 287 ng/mL
Folate, RBC: 683 ng/mL (ref >498)
Hematocrit: 42 % (ref 37.5–51.0)

## 2023-01-22 LAB — MICROALBUMIN / CREATININE URINE RATIO
Creatinine, Urine: 106.8 mg/dL
Microalb/Creat Ratio: 3 mg/g creat (ref 0–29)
Microalbumin, Urine: 3.1 ug/mL

## 2023-01-22 LAB — IRON AND TIBC
Iron Saturation: 29 % (ref 15–55)
Iron: 83 ug/dL (ref 38–169)
Total Iron Binding Capacity: 283 ug/dL (ref 250–450)
UIBC: 200 ug/dL (ref 111–343)

## 2023-01-22 LAB — VITAMIN B12: Vitamin B-12: 343 pg/mL (ref 232–1245)

## 2023-01-27 MED ORDER — VITAMIN B-12 1000 MCG PO TABS: 1000.0000 ug | ORAL_TABLET | Freq: Every day | ORAL | Status: AC

## 2023-01-27 NOTE — Addendum Note (Signed)
Addended by: Marcelino Duster on: 01/27/2023 10:26 AM   Modules accepted: Orders

## 2023-01-30 ENCOUNTER — Other Ambulatory Visit (INDEPENDENT_AMBULATORY_CARE_PROVIDER_SITE_OTHER): Payer: Medicaid Other

## 2023-01-30 DIAGNOSIS — Z1211 Encounter for screening for malignant neoplasm of colon: Secondary | ICD-10-CM

## 2023-01-30 LAB — POC FIT TEST STOOL: Fecal Occult Blood: NEGATIVE

## 2023-02-02 ENCOUNTER — Telehealth: Payer: Self-pay

## 2023-02-02 DIAGNOSIS — H9190 Unspecified hearing loss, unspecified ear: Secondary | ICD-10-CM

## 2023-02-02 NOTE — Telephone Encounter (Signed)
[facility] does no accept medicaid. [facility] does accept his insurance

## 2023-02-10 NOTE — Telephone Encounter (Signed)
Notify he should be hearing from [facility]--make sure he has voicemail set up.

## 2023-02-23 NOTE — Telephone Encounter (Signed)
Patient will call to schedule his appointment once he figures out transportation

## 2023-04-02 LAB — HM DIABETES EYE EXAM

## 2023-04-07 ENCOUNTER — Other Ambulatory Visit (INDEPENDENT_AMBULATORY_CARE_PROVIDER_SITE_OTHER): Payer: Medicaid Other

## 2023-04-07 DIAGNOSIS — D649 Anemia, unspecified: Secondary | ICD-10-CM

## 2023-04-07 DIAGNOSIS — E538 Deficiency of other specified B group vitamins: Secondary | ICD-10-CM

## 2023-04-08 LAB — VITAMIN B12: Vitamin B-12: 1123 pg/mL (ref 232–1245)

## 2023-07-01 ENCOUNTER — Other Ambulatory Visit: Payer: Self-pay | Admitting: Internal Medicine

## 2023-07-03 ENCOUNTER — Other Ambulatory Visit: Payer: Self-pay | Admitting: Internal Medicine

## 2023-07-04 ENCOUNTER — Other Ambulatory Visit: Payer: Self-pay | Admitting: Internal Medicine

## 2023-07-06 ENCOUNTER — Other Ambulatory Visit: Payer: Self-pay

## 2023-07-16 ENCOUNTER — Encounter: Payer: Self-pay | Admitting: Gastroenterology

## 2023-07-16 ENCOUNTER — Other Ambulatory Visit (INDEPENDENT_AMBULATORY_CARE_PROVIDER_SITE_OTHER): Payer: Medicaid Other

## 2023-07-16 DIAGNOSIS — Z79899 Other long term (current) drug therapy: Secondary | ICD-10-CM | POA: Diagnosis not present

## 2023-07-16 DIAGNOSIS — E119 Type 2 diabetes mellitus without complications: Secondary | ICD-10-CM

## 2023-07-17 ENCOUNTER — Other Ambulatory Visit: Payer: Self-pay

## 2023-07-17 ENCOUNTER — Other Ambulatory Visit: Payer: Self-pay | Admitting: Internal Medicine

## 2023-07-20 ENCOUNTER — Ambulatory Visit (INDEPENDENT_AMBULATORY_CARE_PROVIDER_SITE_OTHER): Payer: Medicaid Other | Admitting: Internal Medicine

## 2023-07-20 ENCOUNTER — Encounter: Payer: Self-pay | Admitting: Internal Medicine

## 2023-07-20 VITALS — BP 130/78 | HR 78 | Resp 12 | Ht 63.0 in | Wt 158.0 lb

## 2023-07-20 DIAGNOSIS — D649 Anemia, unspecified: Secondary | ICD-10-CM

## 2023-07-20 DIAGNOSIS — R7989 Other specified abnormal findings of blood chemistry: Secondary | ICD-10-CM | POA: Diagnosis not present

## 2023-07-20 DIAGNOSIS — Z23 Encounter for immunization: Secondary | ICD-10-CM

## 2023-07-20 DIAGNOSIS — E119 Type 2 diabetes mellitus without complications: Secondary | ICD-10-CM

## 2023-07-20 DIAGNOSIS — H9190 Unspecified hearing loss, unspecified ear: Secondary | ICD-10-CM | POA: Diagnosis not present

## 2023-07-20 MED ORDER — EMPAGLIFLOZIN 10 MG PO TABS: 10.0000 mg | ORAL_TABLET | Freq: Every day | ORAL | 11 refills | Status: AC

## 2023-07-20 NOTE — Progress Notes (Signed)
Subjective:    Patient ID: Parker Harvey, male   DOB: 1961-06-20, 62 y.o.   MRN: [id number]   HPI   DM:  A1C remains at good level at 6.5%.   He does have mildly elevated creatinine without microalbuminuria.  Not on ACE I or ARB.  BP has not been elevated.    2.  Elevated Creatinine:  Creatinine back up a bit more.  Not on ACE I or ARB.  As above.    3.  Low normal B12 with normocytic anemia:  was back up to high normal with supplementation earlier in year.  Has not cut back on supplementation.   Was sent for colonoscopy with Dr. Loreta Ave 11/2022, but patient was never scheduled.  Opted to move to [facility] and he is scheduled for tomorrow.  States he cannot make that as taking wife to doctor in [place].  Trying to reschedule him as we speak.    4.  Decreased hearing:  has appt with [facility] in less than 2 weeks.   5.  Has not had this year's COvID vaccine.    Current Meds  Medication Sig   atorvastatin (LIPITOR) 80 MG tablet Take 0.5 tablets (40 mg total) by mouth every evening.   cetirizine (ZYRTEC) 10 MG tablet TAKE 1 TABLET BY MOUTH EVERY DAY   Cholecalciferol (VITAMIN D3) 25 MCG (1000 UT) CAPS Take by mouth. 1 daily   cyanocobalamin (VITAMIN B12) 1000 MCG tablet Take 1 tablet (1,000 mcg total) by mouth daily.   gabapentin (NEURONTIN) 300 MG capsule TAKE 1 CAPSULE(300 MG) BY MOUTH TWICE DAILY   metFORMIN (GLUCOPHAGE-XR) 500 MG 24 hr tablet TAKE 1 TABLET BY MOUTH TWICE DAILY WITH MEALS   Omega-3 Fatty Acids (FISH OIL BURP-LESS) 1000 MG CAPS 2 caps by mouth twice daily.   tiZANidine (ZANAFLEX) 4 MG tablet Take 1 tablet (4 mg total) by mouth every 8 (eight) hours as needed for muscle spasms.   Allergies  Allergen Reactions   Meloxicam Shortness Of Breath    Not clear if true allergy--started out as shortness of breath only when bending over    Naproxen Shortness Of Breath     Review of Systems    Objective:   BP 130/78 (BP Location: Right Arm, Patient Position:  Sitting, Cuff Size: Normal)   Pulse 78   Resp 12   Ht 5\' 3"  (1.6 m)   Wt 158 lb (71.7 kg)   BMI 27.99 kg/m   Physical Exam NAD HEENT:  PERRL, EOMI, dental decay.   Neck:  Supple, No adenopathy Chest:  CTA CV:  RRR without murmur or rub. No carotid bruits.  Carotid, radial and DP pulses normal and equal Abd:  S, NT, No HSM or mass, + BS LE:  No edema  Diabetic Foot Exam - Simple   Simple Foot Form Diabetic Foot exam was performed with the following findings: Yes 07/20/2023 10:40 AM  Visual Inspection No deformities, no ulcerations, no other skin breakdown bilaterally: Yes Sensation Testing Intact to touch and monofilament testing bilaterally: Yes Pulse Check Posterior Tibialis and Dorsalis pulse intact bilaterally: Yes Comments Great toenails with thickening and discoloration.       Assessment & Plan   DM:  controlled, but with up and down creatinine.  Start Jardicance 10 mg daily with morning meal.  A1C and urine microalubmin/crea and BMP in 3 months.  2.  Elevated Creatinine:  as above.  3.  Normocytic anemia:  rescheduled his initial appt  with [facility] for this Friday -- telephonic.  4.  Decreased hearing:  reminder for audiology appt on the 13th of this month.    5.  Borderline low B12: to take 1000 mcg every other day now.    6.  HM:  Covid spikevax today [phone]

## 2023-07-24 ENCOUNTER — Ambulatory Visit (AMBULATORY_SURGERY_CENTER): Payer: Medicaid Other

## 2023-07-24 VITALS — Ht 63.0 in | Wt 158.0 lb

## 2023-07-24 DIAGNOSIS — Z1211 Encounter for screening for malignant neoplasm of colon: Secondary | ICD-10-CM

## 2023-07-24 MED ORDER — PEG 3350-KCL-NA BICARB-NACL 420 G PO SOLR
4000.0000 mL | Freq: Once | ORAL | 0 refills | Status: AC
Start: 2023-07-24 — End: 2023-07-24

## 2023-07-24 NOTE — Progress Notes (Signed)
No egg or soy allergy known to patient  No issues known to pt with past sedation with any surgeries or procedures Patient denies ever being told they had issues or difficulty with intubation  No FH of Malignant Hyperthermia Pt is not on diet pills Pt is not on  home 02  Pt is not on blood thinners  Pt denies issues with constipation  No A fib or A flutter Have any cardiac testing pending--no Pt can ambulate with cane Pt denies use of chewing tobacco Discussed diabetic I weight loss medication holds Discussed NSAID holds Checked BMI Pt instructed to use Singlecare.com or GoodRx for a price reduction on prep  Patient's chart reviewed by Cathlyn Parsons CNRA prior to previsit and patient appropriate for the [facility].  Pre visit completed and red dot placed by patient's name on their procedure day (on provider's schedule).

## 2023-07-28 ENCOUNTER — Ambulatory Visit: Payer: Medicaid Other | Attending: Internal Medicine | Admitting: Audiologist

## 2023-07-28 DIAGNOSIS — H903 Sensorineural hearing loss, bilateral: Secondary | ICD-10-CM | POA: Insufficient documentation

## 2023-07-28 NOTE — Procedures (Signed)
  Outpatient Audiology and [facility] [address] [place], [place]  [address] [phone]  AUDIOLOGICAL  EVALUATION  NAME: Parker Harvey     DOB:   [birth year]      MRN: [id number]                                                                                     DATE: 07/28/2023     REFERENT: Julieanne Manson, MD STATUS: Outpatient DIAGNOSIS: Sensorineural Hearing Loss Bilateral     History: Parker Harvey was seen for an audiological evaluation. Limited case history obtained due to late arrival.  [name] is receiving a hearing evaluation due to concerns for difficulty hearing people clearly. [name] has difficulty hearing his brother who often yells at him from another room. This difficulty began gradually. No pain or pressure reported in either ear. Tinnitus denied for both ears.  Medical history positive for diabetes which is a risk factor for hearing loss. No other relevant case history reported.   Evaluation:  Otoscopy showed a clear view of the tympanic membranes, bilaterally Tympanometry results were consistent with normal middle ear function, bilaterally   Audiometric testing was completed using conventional audiometry with supraural transducer. Speech Recognition Thresholds were 30dB in the right ear and 35dB in the left ear. Word Recognition was performed 40dB SL, scored 96% in the right ear and 84% in the left ear. Pure tone thresholds show sloping sensorineural hearing loss in each ear.   Results:  The test results were reviewed with Parker Harvey. He has sloping sensorineural hearing loss bilaterally. He has good speech understanding once it loud enough. Recommend hearing aids. He was told his insurance will cover hearing aids. He needs to call insurance and have them help him through their process. He was given a copy of his audiogram.    Recommendations:  Amplification is necessary for both ears. Hearing aids can be purchased from a variety of locations. See  provided list for locations in the [place] area.    32 minutes spent testing and counseling on results.   Ammie Ferrier  Audiologist, Au.D., CCC-A 07/28/2023  10:02 AM  Cc: Julieanne Manson, MD

## 2023-07-29 ENCOUNTER — Telehealth: Payer: Self-pay

## 2023-07-29 NOTE — Telephone Encounter (Signed)
Patients wife called to let us know that patient was recommended to get hearing aids. PA needs to be submitted for the request calling [phone]. They need to know procedure code to initiate request.

## 2023-07-30 NOTE — Telephone Encounter (Signed)
Called the number given--we just need to fill out the pre auth and send it back.  They are asking for CPT codes.

## 2023-08-04 ENCOUNTER — Other Ambulatory Visit: Payer: Self-pay | Admitting: Internal Medicine

## 2023-08-06 ENCOUNTER — Other Ambulatory Visit: Payer: Self-pay

## 2023-08-10 ENCOUNTER — Encounter: Payer: Self-pay | Admitting: Gastroenterology

## 2023-08-10 ENCOUNTER — Ambulatory Visit (AMBULATORY_SURGERY_CENTER): Payer: Medicaid Other | Admitting: Gastroenterology

## 2023-08-10 VITALS — BP 106/43 | HR 59 | Temp 98.4°F | Resp 18 | Ht 63.0 in | Wt 158.0 lb

## 2023-08-10 DIAGNOSIS — Z1211 Encounter for screening for malignant neoplasm of colon: Secondary | ICD-10-CM | POA: Diagnosis not present

## 2023-08-10 DIAGNOSIS — D125 Benign neoplasm of sigmoid colon: Secondary | ICD-10-CM

## 2023-08-10 MED ORDER — SODIUM CHLORIDE 0.9 % IV SOLN: 500.0000 mL | Freq: Once | INTRAVENOUS | Status: DC

## 2023-08-10 NOTE — Op Note (Signed)
[facility] Patient Name: Parker Harvey Procedure Date: 08/10/2023 10:31 AM MRN: [id number] Endoscopist: Lorin Picket E. Tomasa Rand , MD, [id number] Age: 62 Referring MD:  Date of Birth: [birth year] Gender: Male Account #: [id number] Procedure:                Colonoscopy Indications:              Screening for colorectal malignant neoplasm (last                            colonoscopy was more than 10 years ago) Medicines:                Monitored Anesthesia Care Procedure:                Pre-Anesthesia Assessment:                           - Prior to the procedure, a History and Physical                            was performed, and patient medications and                            allergies were reviewed. The patient's tolerance of                            previous anesthesia was also reviewed. The risks                            and benefits of the procedure and the sedation                            options and risks were discussed with the patient.                            All questions were answered, and informed consent                            was obtained. Prior Anticoagulants: The patient has                            taken no anticoagulant or antiplatelet agents. ASA                            Grade Assessment: III - A patient with severe                            systemic disease. After reviewing the risks and                            benefits, the patient was deemed in satisfactory                            condition to undergo the procedure.  After obtaining informed consent, the colonoscope                            was passed under direct vision. Throughout the                            procedure, the patient's blood pressure, pulse, and                            oxygen saturations were monitored continuously. The                            Olympus CF-HQ190L ([id number]) Colonoscope was                            introduced  through the anus and advanced to the the                            cecum, identified by appendiceal orifice and                            ileocecal valve. The colonoscopy was performed                            without difficulty. The patient tolerated the                            procedure well. The quality of the bowel                            preparation was adequate. The ileocecal valve,                            appendiceal orifice, and rectum were photographed.                            The bowel preparation used was GoLYTELY via split                            dose instruction. Scope In: 10:42:07 AM Scope Out: 11:01:05 AM Scope Withdrawal Time: 0 hours 12 minutes 55 seconds  Total Procedure Duration: 0 hours 18 minutes 58 seconds  Findings:                 The perianal and digital rectal examinations were                            normal. Pertinent negatives include normal                            sphincter tone and no palpable rectal lesions.                           A 2 mm polyp was found in the sigmoid colon. The  polyp was sessile. The polyp was removed with a                            cold snare. Resection and retrieval were complete.                            Estimated blood loss was minimal.                           Many large-mouthed and small-mouthed diverticula                            were found in the descending colon, transverse                            colon and ascending colon. There was evidence of an                            impacted diverticulum.                           The exam was otherwise normal throughout the                            examined colon.                           The retroflexed view of the distal rectum and anal                            verge was normal and showed no anal or rectal                            abnormalities. Complications:            No immediate complications. Estimated  Blood Loss:     Estimated blood loss was minimal. Impression:               - One 2 mm polyp in the sigmoid colon, removed with                            a cold snare. Resected and retrieved.                           - Severe diverticulosis in the descending colon, in                            the transverse colon and in the ascending colon.                            There was evidence of an impacted diverticulum.                           - The distal rectum and anal verge are normal on  retroflexion view. Recommendation:           - Patient has a contact number available for                            emergencies. The signs and symptoms of potential                            delayed complications were discussed with the                            patient. Return to normal activities tomorrow.                            Written discharge instructions were provided to the                            patient.                           - Resume previous diet.                           - Continue present medications.                           - Await pathology results.                           - Repeat colonoscopy (date not yet determined) for                            surveillance based on pathology results.                           - Recommend daily fiber supplement or high fiber                            diet to reduce risk of diverticular complications. Trameka Dorough E. Tomasa Rand, MD 08/10/2023 11:08:25 AM This report has been signed electronically.

## 2023-08-10 NOTE — Progress Notes (Signed)
Vss nad trans to pacu 

## 2023-08-10 NOTE — Progress Notes (Signed)
VS by DT  Pt's states no medical or surgical changes since previsit or office visit.  

## 2023-08-10 NOTE — Progress Notes (Signed)
[facility] History and Physical   Primary Care Physician:  Julieanne Manson, MD   Reason for Procedure:   Colon cancer screening  Plan:    Screening colonoscopy     HPI: ALCIDE Harvey is a 62 y.o. male undergoing average risk screening colonoscopy.  He has no family history of colon cancer and no chronic GI symptoms. He had a colonoscopy in 2010 with Dr. Loreta Ave which was reportedly normal.   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  Previously followed by Dr. Murray Hodgkins for chronic pain.  Epidural injections help, but pt. concernd with having too many and they hurt.   Dental decay    Diabetes type 2, controlled (HCC)    GERD (gastroesophageal reflux disease)    Hyperlipidemia 2017   Presbyopia     Past Surgical History:  Procedure Laterality Date   BUNIONECTOMY Left 2005   [facility]   low back surgery  2012   Dr. Wynetta Emery:  L4-5 and L5-S1 decompression and stabilization surgery    Prior to Admission medications   Medication Sig Start Date End Date Taking? Authorizing Provider  Accu-Chek Softclix Lancets lancets Check blood sugar twice daily before meals 04/16/20  Yes Julieanne Manson, MD  atorvastatin (LIPITOR) 80 MG tablet TAKE 1/2 TABLET(40 MG) BY MOUTH EVERY EVENING 08/06/23  Yes Julieanne Manson, MD  Blood Glucose Monitoring Suppl (ACCU-CHEK AVIVA PLUS) w/Device KIT Check blood sugar twice daily before meals 04/16/20  Yes Julieanne Manson, MD  cetirizine (ZYRTEC) 10 MG tablet TAKE 1 TABLET BY MOUTH EVERY DAY 08/14/22  Yes Julieanne Manson, MD  Cholecalciferol (VITAMIN D3) 25 MCG (1000 UT) CAPS Take by mouth. 1 daily   Yes [provider]  cyanocobalamin (VITAMIN B12) 1000 MCG tablet Take 1 tablet (1,000 mcg total) by mouth daily. 01/27/23  Yes Julieanne Manson, MD  empagliflozin (JARDIANCE) 10 MG TABS tablet Take 1 tablet (10 mg total) by mouth  daily before breakfast. 07/20/23  Yes Julieanne Manson, MD  gabapentin (NEURONTIN) 300 MG capsule TAKE 1 CAPSULE(300 MG) BY MOUTH TWICE DAILY 07/17/23  Yes Julieanne Manson, MD  glucose blood (ACCU-CHEK AVIVA PLUS) test strip Check blood sugar twice daily before meals 04/16/20  Yes Julieanne Manson, MD  metFORMIN (GLUCOPHAGE-XR) 500 MG 24 hr tablet TAKE 1 TABLET BY MOUTH TWICE DAILY WITH MEALS 07/06/23  Yes Julieanne Manson, MD  Omega-3 Fatty Acids (FISH OIL BURP-LESS) 1000 MG CAPS 2 caps by mouth twice daily. 01/13/22  Yes Julieanne Manson, MD  tiZANidine (ZANAFLEX) 4 MG tablet Take 1 tablet (4 mg total) by mouth every 8 (eight) hours as needed for muscle spasms. 08/06/22  Yes Kathryne Hitch, MD    Current Outpatient Medications  Medication Sig Dispense Refill   Accu-Chek Softclix Lancets lancets Check blood sugar twice daily before meals 100 each 11   atorvastatin (LIPITOR) 80 MG tablet TAKE 1/2 TABLET(40 MG) BY MOUTH EVERY EVENING 15 tablet 11   Blood Glucose Monitoring Suppl (ACCU-CHEK AVIVA PLUS) w/Device KIT Check blood sugar twice daily before meals 1 kit 0   cetirizine (ZYRTEC) 10 MG tablet TAKE 1 TABLET BY MOUTH EVERY DAY 30 tablet 11   Cholecalciferol (VITAMIN D3) 25 MCG (1000 UT) CAPS Take by mouth. 1 daily     cyanocobalamin (VITAMIN B12) 1000 MCG tablet Take 1 tablet (1,000 mcg total) by mouth daily.     empagliflozin (JARDIANCE) 10 MG TABS tablet  Take 1 tablet (10 mg total) by mouth daily before breakfast. 30 tablet 11   gabapentin (NEURONTIN) 300 MG capsule TAKE 1 CAPSULE(300 MG) BY MOUTH TWICE DAILY 60 capsule 11   glucose blood (ACCU-CHEK AVIVA PLUS) test strip Check blood sugar twice daily before meals 100 each 11   metFORMIN (GLUCOPHAGE-XR) 500 MG 24 hr tablet TAKE 1 TABLET BY MOUTH TWICE DAILY WITH MEALS 60 tablet 6   Omega-3 Fatty Acids (FISH OIL BURP-LESS) 1000 MG CAPS 2 caps by mouth twice daily. 60 capsule    tiZANidine (ZANAFLEX) 4 MG tablet Take 1 tablet  (4 mg total) by mouth every 8 (eight) hours as needed for muscle spasms. 40 tablet 1   Current Facility-Administered Medications  Medication Dose Route Frequency Provider Last Rate Last Admin   0.9 %  sodium chloride infusion  500 mL Intravenous Once Jenel Lucks, MD        Allergies as of 08/10/2023 - Review Complete 08/10/2023  Allergen Reaction Noted   Meloxicam Shortness Of Breath 04/22/2018   Naproxen Shortness Of Breath 04/22/2018    Family History  Problem Relation Age of Onset   Hypertension Mother    Diabetes Mother    Heart disease Mother        MI cause of death   Heart disease Father 39       MI-cause of death   Hypertension Father    Stroke Father    Cancer Sister        Breast   Cancer Sister        Breast   Hypertension Brother    Hypertension Brother    Heart disease Brother        2 MIs and CABG   Colon polyps Neg Hx    Crohn's disease Neg Hx    Esophageal cancer Neg Hx    Rectal cancer Neg Hx    Stomach cancer Neg Hx     Social History   Socioeconomic History   Marital status: Married    Spouse name: Markal Cedillos   Number of children: 3   Years of education: 10   Highest education level: Not on file  Occupational History   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  Bored and dips. Limited by back.  States can stop on own.  Continues to feel he can do this on own.    Vaping Use   Vaping status: Never Used  Substance and Sexual Activity   Alcohol use: Not Currently    Alcohol/week: 0.0 standard drinks of alcohol    Comment: stopped in 2020.   Drug use: No   Sexual activity: Yes    Comment: wife post  menopausal  Other Topics Concern   Not on file  Social History Narrative   Lives off [address] now with wife.    Has 3 children he considers his own, but are biologically his wife's and her previous partner's   Unable to work due to chronic back issues.   Social Determinants of Health   Financial Resource Strain: Low Risk  (01/19/2023)  Overall Financial Resource Strain (CARDIA)    Difficulty of Paying Living Expenses: Not hard at all  Food Insecurity: No Food Insecurity (01/19/2023)   Hunger Vital Sign    Worried About Running Out of Food in the Last Year: Never true    Ran Out of Food in the Last Year: Never true  Transportation Needs: No Transportation Needs (01/19/2023)   PRAPARE - Administrator, Civil Service (Medical): No    Lack of Transportation (Non-Medical): No  Physical Activity: Insufficiently Active (06/10/2019)   Exercise Vital Sign    Days of Exercise per Week: 2 days    Minutes of Exercise per Session: 30 min  Stress: No Stress Concern Present (06/10/2019)   [employer] of Occupational Health - Occupational Stress Questionnaire    Feeling of Stress : Only a little  Social Connections: Unknown (06/10/2019)   Social Connection and Isolation Panel [NHANES]    Frequency of Communication with Friends and Family: More than three times a week    Frequency of Social Gatherings with Friends and Family: Never    Attends Religious Services: Never    Database administrator or Organizations: Not on file    Attends Banker Meetings: Not on file    Marital Status: Married  Intimate Partner Violence: Not At Risk (01/19/2023)   Humiliation, Afraid, Rape, and Kick questionnaire    Fear of Current or Ex-Partner: No    Emotionally Abused: No    Physically Abused: No    Sexually Abused: No    Review of Systems:  All other review of systems negative except as mentioned in the HPI.  Physical Exam: Vital signs BP 125/85   Pulse (!) 47   Temp 98.4  F (36.9 C)   Resp 20   Ht 5\' 3"  (1.6 m)   Wt 158 lb (71.7 kg)   SpO2 100%   BMI 27.99 kg/m   General:   Alert,  Well-developed, well-nourished, pleasant and cooperative in NAD Airway:  Mallampati 2 Lungs:  Clear throughout to auscultation.   Heart:  Regular rate and rhythm; no murmurs, clicks, rubs,  or gallops. Abdomen:  Soft, nontender and nondistended. Normal bowel sounds.   Neuro/Psych:  Normal mood and affect. A and O x 3   Oluwasemilore Bahl E. Tomasa Rand, MD [facility]

## 2023-08-10 NOTE — Patient Instructions (Signed)
Handouts provided on polyps, diverticulosis and high fiber diet. Resume previous diet.  Continue present medications.  Await pathology results.  Repeat colonoscopy (date not yet determined) for surveillance based on pathology results.  Recommend daily fiber supplement or high fiber diet to reduce risk of diverticular complications.     YOU HAD AN ENDOSCOPIC PROCEDURE TODAY AT THE [facility]:   Refer to the procedure report that was given to you for any specific questions about what was found during the examination.  If the procedure report does not answer your questions, please call your gastroenterologist to clarify.  If you requested that your care partner not be given the details of your procedure findings, then the procedure report has been included in a sealed envelope for you to review at your convenience later.  YOU SHOULD EXPECT: Some feelings of bloating in the abdomen. Passage of more gas than usual.  Walking can help get rid of the air that was put into your GI tract during the procedure and reduce the bloating. If you had a lower endoscopy (such as a colonoscopy or flexible sigmoidoscopy) you may notice spotting of blood in your stool or on the toilet paper. If you underwent a bowel prep for your procedure, you may not have a normal bowel movement for a few days.  Please Note:  You might notice some irritation and congestion in your nose or some drainage.  This is from the oxygen used during your procedure.  There is no need for concern and it should clear up in a day or so.  SYMPTOMS TO REPORT IMMEDIATELY:  Following lower endoscopy (colonoscopy or flexible sigmoidoscopy):  Excessive amounts of blood in the stool  Significant tenderness or worsening of abdominal pains  Swelling of the abdomen that is new, acute  Fever of 100F or higher  For urgent or emergent issues, a gastroenterologist can be reached at any hour by calling [phone]. Do not use MyChart  messaging for urgent concerns.    DIET:  We do recommend a small meal at first, but then you may proceed to your regular diet.  Drink plenty of fluids but you should avoid alcoholic beverages for 24 hours.  ACTIVITY:  You should plan to take it easy for the rest of today and you should NOT DRIVE or use heavy machinery until tomorrow (because of the sedation medicines used during the test).    FOLLOW UP: Our staff will call the number listed on your records the next business day following your procedure.  We will call around 7:15- 8:00 am to check on you and address any questions or concerns that you may have regarding the information given to you following your procedure. If we do not reach you, we will leave a message.     If any biopsies were taken you will be contacted by phone or by letter within the next 1-3 weeks.  Please call us at [phone] if you have not heard about the biopsies in 3 weeks.    SIGNATURES/CONFIDENTIALITY: You and/or your care partner have signed paperwork which will be entered into your electronic medical record.  These signatures attest to the fact that that the information above on your After Visit Summary has been reviewed and is understood.  Full responsibility of the confidentiality of this discharge information lies with you and/or your care-partner.

## 2023-08-11 ENCOUNTER — Telehealth: Payer: Self-pay | Admitting: *Deleted

## 2023-08-11 NOTE — Telephone Encounter (Signed)
Post procedure follow up call placed, no answer and VM isn't set up.  

## 2023-08-13 ENCOUNTER — Encounter: Payer: Self-pay | Admitting: Gastroenterology

## 2023-08-23 ENCOUNTER — Other Ambulatory Visit: Payer: Self-pay | Admitting: Internal Medicine

## 2023-08-25 NOTE — Telephone Encounter (Signed)
Spoke to [facility], was told that they do not do hearing aid fitment. Patient needs to be sent to a different office for hearing aid ordering and fitment.

## 2023-08-26 ENCOUNTER — Telehealth: Payer: Self-pay

## 2023-08-26 NOTE — Telephone Encounter (Signed)
Patient needs to be sent to a different audiologist to get hearing aids ordered and fitment. [place] does not due hearing aid fitment.

## 2023-08-27 ENCOUNTER — Other Ambulatory Visit: Payer: Self-pay

## 2023-08-28 NOTE — Telephone Encounter (Signed)
Call into [facility] to see if any direction on  how to proceed to get him hearing aids.

## 2023-08-31 NOTE — Telephone Encounter (Signed)
Referral was sent to [facility] on Friday 08/28/2023.

## 2023-10-22 ENCOUNTER — Other Ambulatory Visit: Payer: Medicaid Other

## 2023-10-22 DIAGNOSIS — E119 Type 2 diabetes mellitus without complications: Secondary | ICD-10-CM

## 2023-10-22 DIAGNOSIS — Z79899 Other long term (current) drug therapy: Secondary | ICD-10-CM | POA: Diagnosis not present

## 2023-10-22 NOTE — Addendum Note (Signed)
Addended by: Duayne Cal on: 10/22/2023 09:30 AM   Modules accepted: Orders

## 2023-10-23 LAB — BASIC METABOLIC PANEL
BUN/Creatinine Ratio: 8 — ABNORMAL LOW (ref 10–24)
BUN: 11 mg/dL (ref 8–27)
CO2: 23 mmol/L (ref 20–29)
Calcium: 9.9 mg/dL (ref 8.6–10.2)
Chloride: 106 mmol/L (ref 96–106)
Creatinine, Ser: 1.41 mg/dL — ABNORMAL HIGH (ref 0.76–1.27)
Glucose: 103 mg/dL — ABNORMAL HIGH (ref 70–99)
Potassium: 4.6 mmol/L (ref 3.5–5.2)
Sodium: 142 mmol/L (ref 134–144)
eGFR: 57 mL/min/{1.73_m2} — ABNORMAL LOW (ref >59)

## 2023-10-23 LAB — HEMOGLOBIN A1C
Est. average glucose Bld gHb Est-mCnc: 143 mg/dL
Hgb A1c MFr Bld: 6.6 % — ABNORMAL HIGH (ref 4.8–5.6)

## 2023-10-23 LAB — MICROALBUMIN / CREATININE URINE RATIO
Creatinine, Urine: 105.2 mg/dL
Microalb/Creat Ratio: 5 mg/g{creat} (ref 0–29)
Microalbumin, Urine: 5 ug/mL

## 2023-10-23 LAB — SPECIMEN STATUS REPORT

## 2024-01-22 ENCOUNTER — Other Ambulatory Visit (INDEPENDENT_AMBULATORY_CARE_PROVIDER_SITE_OTHER): Payer: Medicaid Other

## 2024-01-22 DIAGNOSIS — E782 Mixed hyperlipidemia: Secondary | ICD-10-CM | POA: Diagnosis not present

## 2024-01-22 DIAGNOSIS — Z0000 Encounter for general adult medical examination without abnormal findings: Secondary | ICD-10-CM | POA: Diagnosis not present

## 2024-01-22 DIAGNOSIS — D649 Anemia, unspecified: Secondary | ICD-10-CM

## 2024-01-22 DIAGNOSIS — E119 Type 2 diabetes mellitus without complications: Secondary | ICD-10-CM | POA: Diagnosis not present

## 2024-01-23 LAB — LIPID PANEL W/O CHOL/HDL RATIO
Cholesterol, Total: 97 mg/dL — ABNORMAL LOW (ref 100–199)
HDL: 39 mg/dL — ABNORMAL LOW (ref >39)
LDL Chol Calc (NIH): 44 mg/dL (ref 0–99)
Triglycerides: 60 mg/dL (ref 0–149)
VLDL Cholesterol Cal: 14 mg/dL (ref 5–40)

## 2024-01-23 LAB — CBC WITH DIFFERENTIAL/PLATELET
Basophils Absolute: 0 10*3/uL (ref 0.0–0.2)
Basos: 0 %
EOS (ABSOLUTE): 0.2 10*3/uL (ref 0.0–0.4)
Eos: 3 %
Hematocrit: 42.9 % (ref 37.5–51.0)
Hemoglobin: 13.3 g/dL (ref 13.0–17.7)
Immature Grans (Abs): 0 10*3/uL (ref 0.0–0.1)
Immature Granulocytes: 0 %
Lymphocytes Absolute: 3.4 10*3/uL — ABNORMAL HIGH (ref 0.7–3.1)
Lymphs: 51 %
MCH: 28.1 pg (ref 26.6–33.0)
MCHC: 31 g/dL — ABNORMAL LOW (ref 31.5–35.7)
MCV: 91 fL (ref 79–97)
Monocytes Absolute: 0.6 10*3/uL (ref 0.1–0.9)
Monocytes: 9 %
Neutrophils Absolute: 2.5 10*3/uL (ref 1.4–7.0)
Neutrophils: 37 %
Platelets: 258 10*3/uL (ref 150–450)
RBC: 4.74 x10E6/uL (ref 4.14–5.80)
RDW: 12.8 % (ref 11.6–15.4)
WBC: 6.8 10*3/uL (ref 3.4–10.8)

## 2024-01-23 LAB — COMPREHENSIVE METABOLIC PANEL
ALT: 28 [IU]/L (ref 0–44)
AST: 31 [IU]/L (ref 0–40)
Albumin: 4.6 g/dL (ref 3.8–4.9)
Alkaline Phosphatase: 76 [IU]/L (ref 44–121)
BUN/Creatinine Ratio: 7 — ABNORMAL LOW (ref 10–24)
BUN: 10 mg/dL (ref 8–27)
Bilirubin Total: 0.8 mg/dL (ref 0.0–1.2)
CO2: 23 mmol/L (ref 20–29)
Calcium: 9.7 mg/dL (ref 8.6–10.2)
Chloride: 102 mmol/L (ref 96–106)
Creatinine, Ser: 1.35 mg/dL — ABNORMAL HIGH (ref 0.76–1.27)
Globulin, Total: 2.5 g/dL (ref 1.5–4.5)
Glucose: 106 mg/dL — ABNORMAL HIGH (ref 70–99)
Potassium: 4.2 mmol/L (ref 3.5–5.2)
Sodium: 142 mmol/L (ref 134–144)
Total Protein: 7.1 g/dL (ref 6.0–8.5)
eGFR: 60 mL/min/{1.73_m2} (ref >59)

## 2024-01-23 LAB — HEMOGLOBIN A1C
Est. average glucose Bld gHb Est-mCnc: 143 mg/dL
Hgb A1c MFr Bld: 6.6 % — ABNORMAL HIGH (ref 4.8–5.6)

## 2024-01-23 LAB — PSA: Prostate Specific Ag, Serum: 0.8 ng/mL (ref 0.0–4.0)

## 2024-01-27 ENCOUNTER — Ambulatory Visit (INDEPENDENT_AMBULATORY_CARE_PROVIDER_SITE_OTHER): Payer: Medicaid Other | Admitting: Internal Medicine

## 2024-01-27 ENCOUNTER — Encounter: Payer: Self-pay | Admitting: Internal Medicine

## 2024-01-27 VITALS — BP 110/80 | HR 73 | Ht 62.5 in | Wt 159.0 lb

## 2024-01-27 DIAGNOSIS — E782 Mixed hyperlipidemia: Secondary | ICD-10-CM

## 2024-01-27 DIAGNOSIS — N182 Chronic kidney disease, stage 2 (mild): Secondary | ICD-10-CM

## 2024-01-27 DIAGNOSIS — E119 Type 2 diabetes mellitus without complications: Secondary | ICD-10-CM

## 2024-01-27 DIAGNOSIS — Z0000 Encounter for general adult medical examination without abnormal findings: Secondary | ICD-10-CM

## 2024-01-27 DIAGNOSIS — Z23 Encounter for immunization: Secondary | ICD-10-CM | POA: Diagnosis not present

## 2024-01-27 DIAGNOSIS — M21611 Bunion of right foot: Secondary | ICD-10-CM | POA: Diagnosis not present

## 2024-01-27 NOTE — Progress Notes (Signed)
Subjective:    Patient ID: Parker Harvey, male   DOB: [birth year], 63 y.o.   MRN: [id number]   HPI  Here for Male CPE:  1.  STE:  Does perform.  No concerning findings.  No family history of testicular cancer.    2.  PSA: normal on the 7th at 0.8.  No family history of prostate cancer.    3.  Guaiac Cards/FIT:  Last 01/2023 and negative.  4.  Colonoscopy:  Last 07/2023 with diverticulosis and one polyp/tubular adenoma with Dr.  Tomasa Rand.  Next due in 2031.    5.  Cholesterol/Glucose:  Cholesterol okay save for low HDL.  He does eat nuts, but salted,  and fish.  No avocados or olives.  He is not physically active due to back pain.   A1C stable at 6.6%.   Lipid Panel     Component Value Date/Time   CHOL 97 (L) 01/22/2024 0837   TRIG 60 01/22/2024 0837   HDL 39 (L) 01/22/2024 0837   CHOLHDL 2.6 08/18/2013 1008   VLDL 71 (H) 08/18/2013 1008   LDLCALC 44 01/22/2024 0837   LABVLDL 14 01/22/2024 0837      6.  Immunizations:  Has not had pneumococcal conjugate.  Has not had more up to date COVID  Immunization History  Administered Date(s) Administered   Influenza Inj Mdck Quad Pf 12/23/2022   Influenza,inj,Quad PF,6+ Mos 08/18/2013   Influenza-Unspecified 11/23/2015, 09/30/2018, 09/15/2019, 09/14/2021, 09/04/2023   Janssen (J&J) SARS-COV-2 Vaccination 03/21/2020   Moderna Covid-19 Fall Seasonal Vaccine 78yrs & older 07/20/2023   Moderna Covid-19 Vaccine Bivalent Booster 98yrs & up 01/13/2022   PFIZER Comirnaty(Gray Top)Covid-19 Tri-Sucrose Vaccine 08/05/2021   Pneumococcal Polysaccharide-23 06/10/2019   Td 12/13/2019   Tdap 12/15/2008   Zoster Recombinant(Shingrix) 04/16/2022, 07/14/2022     Current Meds  Medication Sig   Accu-Chek Softclix Lancets lancets Check blood sugar twice daily before meals   atorvastatin (LIPITOR) 80 MG tablet TAKE 1/2 TABLET(40 MG) BY MOUTH EVERY EVENING   Blood Glucose Monitoring Suppl (ACCU-CHEK AVIVA PLUS) w/Device KIT Check blood  sugar twice daily before meals   cetirizine (ZYRTEC) 10 MG tablet TAKE 1 TABLET BY MOUTH EVERY DAY   Cholecalciferol (VITAMIN D3) 25 MCG (1000 UT) CAPS Take by mouth. 1 daily   cyanocobalamin (VITAMIN B12) 1000 MCG tablet Take 1 tablet (1,000 mcg total) by mouth daily.   empagliflozin (JARDIANCE) 10 MG TABS tablet Take 1 tablet (10 mg total) by mouth daily before breakfast.   gabapentin (NEURONTIN) 300 MG capsule TAKE 1 CAPSULE(300 MG) BY MOUTH TWICE DAILY   glucose blood (ACCU-CHEK AVIVA PLUS) test strip Check blood sugar twice daily before meals   metFORMIN (GLUCOPHAGE-XR) 500 MG 24 hr tablet TAKE 1 TABLET BY MOUTH TWICE DAILY WITH MEALS   Omega-3 Fatty Acids (FISH OIL BURP-LESS) 1000 MG CAPS 2 caps by mouth twice daily.   tiZANidine (ZANAFLEX) 4 MG tablet Take 1 tablet (4 mg total) by mouth every 8 (eight) hours as needed for muscle spasms.   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  Previously followed by Dr. Murray Hodgkins for chronic pain.  Epidural injections help, but pt. concernd with having  too many and they hurt.   Dental decay    Diabetes type 2, controlled (HCC)    GERD (gastroesophageal reflux disease)    Hearing loss    Hyperlipidemia 2017   Presbyopia    Past Surgical History:  Procedure Laterality Date   BUNIONECTOMY Left 2005   [facility]   low back surgery  2012   Dr. Wynetta Emery:  L4-5 and L5-S1 decompression and stabilization surgery   Family History  Problem Relation Age of Onset   Hypertension Mother    Diabetes Mother    Heart disease Mother        MI cause of death   Heart disease Father 55       MI-cause of death   Hypertension Father    Stroke Father    Cancer Sister        Breast    Cancer Sister        Breast   Hypertension Brother    Hypertension Brother    Heart disease Brother        2 MIs and CABG   Colon polyps Neg Hx    Crohn's disease Neg Hx    Esophageal cancer Neg Hx    Rectal cancer Neg Hx    Stomach cancer Neg Hx    Social History   Socioeconomic History   Marital status: Married    Spouse name: Ramell Wacha   Number of children: 3   Years of education: 10   Highest education level: Not on file  Occupational History   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  Bored and dips. Limited by back.  States can stop on own.  Continues to feel he can do this on own.  Refuses to consider nicotine gum or lozenges  Vaping Use   Vaping status: Never Used  Substance and Sexual Activity   Alcohol use: Not Currently    Alcohol/week: 0.0 standard drinks of alcohol    Comment: stopped in 2020.   Drug use: No   Sexual activity: Yes    Comment: wife post menopausal  Other Topics Concern   Not on file  Social History Narrative   Lives off [address] now with wife.    Has 3 children he considers his own, but are biologically his wife's and her previous partner's   Unable to work due to chronic back issues.   Social Drivers of Corporate investment banker Strain: Low Risk  (01/27/2024)   Overall Financial Resource Strain (CARDIA)    Difficulty of Paying Living Expenses: Not very hard  Food Insecurity: No Food Insecurity (01/27/2024)   Hunger Vital Sign    Worried About Running Out of Food in the Last Year: Never true    Ran  Out of Food in the Last Year: Never true  Transportation Needs: No Transportation Needs (01/27/2024)   PRAPARE - Administrator, Civil Service (Medical): No    Lack of Transportation (  Non-Medical): No  Physical Activity: Insufficiently Active (06/10/2019)   Exercise Vital Sign    Days of Exercise per Week: 2 days    Minutes of Exercise per Session: 30 min  Stress: No Stress Concern Present (06/10/2019)   [employer] of Occupational Health - Occupational Stress Questionnaire    Feeling of Stress : Only a little  Social Connections: Unknown (06/10/2019)   Social Connection and Isolation Panel [NHANES]    Frequency of Communication with Friends and Family: More than three times a week    Frequency of Social Gatherings with Friends and Family: Never    Attends Religious Services: Never    Database administrator or Organizations: Not on file    Attends Banker Meetings: Not on file    Marital Status: Married  Intimate Partner Violence: Not At Risk (01/27/2024)   Humiliation, Afraid, Rape, and Kick questionnaire    Fear of Current or Ex-Partner: No    Emotionally Abused: No    Physically Abused: No    Sexually Abused: No     Review of Systems  HENT:  Positive for dental problem (He is looking to get partials He states he has a Education officer, community, but has not been back.) and hearing loss (Has follow up with audiology for further evaluation/hearing aides.).   Eyes:  Negative for visual disturbance (Follows with Dr. Dione Booze.  Has been seen in past year).  Respiratory:  Negative for shortness of breath.   Cardiovascular:  Negative for chest pain, palpitations and leg swelling.  Gastrointestinal:  Negative for abdominal pain and blood in stool (No melena).  Genitourinary:  Negative for decreased urine volume (no signficant nocturia unless drinks a lot of fluids).  Musculoskeletal:  Positive for back pain (Chronic low back pain with bilateral radiculopathy.  The radiculopathy  seemed to resolve after surgery in 2012 with Dr. Wynetta Emery.  He continues to have issues with low back pain, but is hesitant to go back for further evaluation.  No LE weakness or foot drop.).  Neurological:  Negative for weakness and numbness.  Psychiatric/Behavioral:  Negative for dysphoric mood. The patient is not nervous/anxious.       Objective:   BP 110/80 (BP Location: Right Arm, Patient Position: Sitting, Cuff Size: Normal)   Pulse 73   Ht 5' 2.5" (1.588 m)   Wt 159 lb (72.1 kg)   SpO2 99%   BMI 28.62 kg/m   Physical Exam HENT:     Head: Normocephalic and atraumatic.     Right Ear: Tympanic membrane, ear canal and external ear normal.     Left Ear: Tympanic membrane, ear canal and external ear normal.     Nose: Nose normal.     Mouth/Throat:     Mouth: Mucous membranes are moist.     Pharynx: Oropharynx is clear.     Comments: Few teeth left.  Decayed with gingival recession Eyes:     Extraocular Movements: Extraocular movements intact.     Conjunctiva/sclera: Conjunctivae normal.     Pupils: Pupils are equal, round, and reactive to light.     Comments: Arcus senilis bilaterally.  Discs sharp  Neck:     Thyroid: No thyroid mass or thyromegaly.  Cardiovascular:     Rate and Rhythm: Normal rate and regular rhythm.     Pulses:          Dorsalis pedis pulses are 2+ on the right side and 2+ on the left side.  Posterior tibial pulses are 2+ on the right side and 2+ on the left side.     Heart sounds: S1 normal and S2 normal. No murmur heard.    No friction rub. No S3 or S4 sounds.     Comments: Varicosities of left greater than right LE.   No carotid bruits.  Carotid, radial, femoral, DP and PT pulses normal and equal.   Pulmonary:     Effort: Pulmonary effort is normal.     Breath sounds: Normal breath sounds and air entry.  Abdominal:     General: Bowel sounds are normal.     Palpations: Abdomen is soft. There is no hepatomegaly, splenomegaly or mass.      Tenderness: There is no abdominal tenderness.     Hernia: A hernia (small reducible umbilical hernia) is present.  Genitourinary:    Penis: Circumcised.      Testes:        Right: Tenderness or swelling not present. Right testis is descended.        Left: Tenderness or swelling not present. Left testis is descended.  Musculoskeletal:        General: Normal range of motion.     Cervical back: Normal range of motion and neck supple.     Right lower leg: No edema.     Left lower leg: No edema.  Feet:     Right foot:     Protective Sensation: 10 sites tested.  10 sites sensed.     Skin integrity: Skin integrity normal.     Toenail Condition: Right toenails are normal.     Left foot:     Protective Sensation: 10 sites tested.  10 sites sensed.     Skin integrity: Dry skin present.     Toenail Condition: Left toenails are abnormally thick. Fungal disease present. Lymphadenopathy:     Head:     Right side of head: No submental or submandibular adenopathy.     Left side of head: No submental or submandibular adenopathy.     Cervical: No cervical adenopathy.     Upper Body:     Right upper body: No supraclavicular or axillary adenopathy.     Left upper body: No supraclavicular or axillary adenopathy.     Lower Body: No right inguinal adenopathy. No left inguinal adenopathy.  Skin:    General: Skin is warm.     Capillary Refill: Capillary refill takes less than 2 seconds.     Findings: No rash.  Neurological:     General: No focal deficit present.     Mental Status: He is alert and oriented to person, place, and time.     Cranial Nerves: Cranial nerves 2-12 are intact.     Sensory: Sensation is intact.     Motor: Motor function is intact.     Coordination: Coordination is intact.     Gait: Gait is intact.     Deep Tendon Reflexes: Reflexes are normal and symmetric.  Psychiatric:        Mood and Affect: Mood normal.        Speech: Speech normal.        Behavior: Behavior normal.  Behavior is cooperative.      Assessment & Plan   CPE Pneumococcal 20 Encouraged him to obtain current COVID vaccine.  2.  Hyperlipidemia:  at goal save for HDL.  Encouraged water exercise or recumbent bike when not summer and gardening.    3.  Dental decay:  handout on dentists who accept Medicaid.  4.  Right bunion:  referral to [facility].  5.  Low back pain:  He will let me know if and when he is interested in going back to Dr. Wynetta Emery., NS, who performed surgery in past.  6.  DM:  at goal.  To make sure he follows up with Dr. Dione Booze yearly for eye exam  7.  Recurrent left great toenail onychomycosis:  follow for now.  Has been previously treated, but comes back per patient.

## 2024-02-03 ENCOUNTER — Other Ambulatory Visit: Payer: Self-pay | Admitting: Internal Medicine

## 2024-02-03 ENCOUNTER — Other Ambulatory Visit: Payer: Self-pay

## 2024-02-08 ENCOUNTER — Ambulatory Visit (INDEPENDENT_AMBULATORY_CARE_PROVIDER_SITE_OTHER): Payer: Medicaid Other | Admitting: Podiatry

## 2024-02-08 ENCOUNTER — Encounter: Payer: Self-pay | Admitting: Podiatry

## 2024-02-08 ENCOUNTER — Ambulatory Visit (INDEPENDENT_AMBULATORY_CARE_PROVIDER_SITE_OTHER): Payer: Medicaid Other

## 2024-02-08 DIAGNOSIS — M2011 Hallux valgus (acquired), right foot: Secondary | ICD-10-CM | POA: Diagnosis not present

## 2024-02-08 NOTE — Progress Notes (Signed)
   Chief Complaint  Patient presents with   Foot Pain    "I have a bunion." N - bunion L - right D - over 20 yrs O - suddenly, about the same C - bigger than the left one that I had surgery on A - none T - none    HPI: 63 y.o. male presenting today as a new patient for evaluation of a bunion to the right foot.  Patient says that he really does not have any pain associated to the bunion.  He did have bunion repair to the left foot over 20 years ago.  It is doing well without complication.  Presenting for further treatment evaluation.  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  Previously followed by Dr. Murray Hodgkins for chronic pain.  Epidural injections help, but pt. concernd with having too many and they hurt.   Dental decay    Diabetes type 2, controlled (HCC)    GERD (gastroesophageal reflux disease)    Hearing loss    Hyperlipidemia 2017   Presbyopia     Past Surgical History:  Procedure Laterality Date   BUNIONECTOMY Left 2005   [facility]   low back surgery  2012   Dr. Wynetta Emery:  L4-5 and L5-S1 decompression and stabilization surgery    Allergies  Allergen Reactions   Meloxicam Shortness Of Breath    Not clear if true allergy--started out as shortness of breath only when bending over    Naproxen Shortness Of Breath     Physical Exam: General: The patient is alert and oriented x3 in no acute distress.  Dermatology: Skin is warm, dry and supple bilateral lower extremities.   Vascular: Palpable pedal pulses bilaterally. Capillary refill within normal limits.  No appreciable edema.  No erythema.  Neurological: Grossly intact via light touch  Musculoskeletal Exam: Hallux valgus deformity noted with hypertrophic medial eminence of the first metatarsal.  Range of motion WNL.  No tenderness with palpation.  Radiographic Exam RT foot 02/08/2024:   Normal osseous mineralization.  Increased intermetatarsal space with a medial deviation of the first metatarsal head and hypertrophy of the medial eminence of the first metatarsal with ossicle as well.  No acute fractures identified.  Findings are consistent with bunion deformity  Assessment/Plan of Care: 1.  Hallux valgus/bunion right 2.  History of bunionectomy LT over 20 years ago  -Patient evaluated.  X-rays reviewed -Currently the patient says that the bunion is mostly asymptomatic.  For now we will continue conservative care and observation -Continue wearing good supportive tennis shoes and sneakers.  Advise against going barefoot -Recommend wide fitting shoes that do not irritate or constrict the bunion area -Return to clinic as needed       Felecia Shelling, DPM [facility]  Dr. Felecia Shelling, DPM    [address] [place], [place] [address]                Office [phone]  Fax [phone]

## 2024-07-11 ENCOUNTER — Other Ambulatory Visit: Payer: Self-pay | Admitting: Internal Medicine

## 2024-07-15 ENCOUNTER — Other Ambulatory Visit: Payer: Self-pay | Admitting: Internal Medicine

## 2024-07-25 ENCOUNTER — Other Ambulatory Visit: Payer: Medicaid Other

## 2024-07-25 DIAGNOSIS — E782 Mixed hyperlipidemia: Secondary | ICD-10-CM

## 2024-07-25 DIAGNOSIS — E119 Type 2 diabetes mellitus without complications: Secondary | ICD-10-CM

## 2024-07-25 NOTE — Addendum Note (Signed)
 Addended by: DASIE VIKI SAILOR on: 07/25/2024 10:05 AM   Modules accepted: Orders

## 2024-07-26 ENCOUNTER — Ambulatory Visit: Payer: Self-pay | Admitting: Internal Medicine

## 2024-07-26 LAB — HEMOGLOBIN A1C
Est. average glucose Bld gHb Est-mCnc: 140 mg/dL
Hgb A1c MFr Bld: 6.5 % — ABNORMAL HIGH (ref 4.8–5.6)

## 2024-07-26 LAB — LIPID PANEL
Chol/HDL Ratio: 2.6 ratio (ref 0.0–5.0)
Cholesterol, Total: 114 mg/dL (ref 100–199)
HDL: 44 mg/dL (ref >39)
LDL Chol Calc (NIH): 54 mg/dL (ref 0–99)
Triglycerides: 77 mg/dL (ref 0–149)
VLDL Cholesterol Cal: 16 mg/dL (ref 5–40)

## 2024-07-28 ENCOUNTER — Ambulatory Visit (INDEPENDENT_AMBULATORY_CARE_PROVIDER_SITE_OTHER): Payer: Medicaid Other | Admitting: Internal Medicine

## 2024-07-28 ENCOUNTER — Encounter: Payer: Self-pay | Admitting: Internal Medicine

## 2024-07-28 VITALS — BP 116/76 | HR 75 | Resp 17 | Ht 62.5 in | Wt 157.0 lb

## 2024-07-28 DIAGNOSIS — E119 Type 2 diabetes mellitus without complications: Secondary | ICD-10-CM | POA: Diagnosis not present

## 2024-07-28 DIAGNOSIS — M722 Plantar fascial fibromatosis: Secondary | ICD-10-CM | POA: Insufficient documentation

## 2024-07-28 DIAGNOSIS — E782 Mixed hyperlipidemia: Secondary | ICD-10-CM

## 2024-07-28 NOTE — Patient Instructions (Signed)
 Heel cups or cushions for work boots  Stretches before getting out of bed in morning  Cushioned slippers when in house--no bare or stockinged feet  Frozen water bottle to roll back and forth with feet while watching TV daily.

## 2024-07-28 NOTE — Progress Notes (Unsigned)
    Subjective:    Patient ID: Parker Harvey, male   DOB: [birth year], 63 y.o.   MRN: [id number]   HPI   DM:  A1C is 6.5% and he is happy with his current regimen.  Does not want to consider anything that is injected, even Ozempic once weekly.  He has not been into Dr. Octavia for his yearly eye check.    2.  Pain in heels when gets up in morning which seems to improve as he walks around.  Does go barefoot.  Hurt with work shoes, but not with his [employer].   Has not tried cushion inserts into workboots.    3.  Hypercholesterolemia:  recent panel at goal.   Lipid Panel     Component Value Date/Time   CHOL 114 07/25/2024 0935   TRIG 77 07/25/2024 0935   HDL 44 07/25/2024 0935   CHOLHDL 2.6 07/25/2024 0935   CHOLHDL 2.6 08/18/2013 1008   VLDL 71 (H) 08/18/2013 1008   LDLCALC 54 07/25/2024 0935   LABVLDL 16 07/25/2024 0935       Current Meds  Medication Sig  . Accu-Chek Softclix Lancets lancets Check blood sugar twice daily before meals  . atorvastatin  (LIPITOR) 80 MG tablet TAKE 1/2 TABLET(40 MG) BY MOUTH EVERY EVENING  . Blood Glucose Monitoring Suppl (ACCU-CHEK AVIVA PLUS) w/Device KIT Check blood sugar twice daily before meals  . cetirizine  (ZYRTEC ) 10 MG tablet TAKE 1 TABLET BY MOUTH EVERY DAY  . Cholecalciferol (VITAMIN D3) 25 MCG (1000 UT) CAPS Take by mouth. 1 daily  . cyanocobalamin (VITAMIN B12) 1000 MCG tablet Take 1 tablet (1,000 mcg total) by mouth daily.  . empagliflozin  (JARDIANCE ) 10 MG TABS tablet Take 1 tablet (10 mg total) by mouth daily before breakfast.  . gabapentin  (NEURONTIN ) 300 MG capsule TAKE 1 CAPSULE(300 MG) BY MOUTH TWICE DAILY  . glucose blood (ACCU-CHEK AVIVA PLUS) test strip Check blood sugar twice daily before meals  . metFORMIN  (GLUCOPHAGE -XR) 500 MG 24 hr tablet TAKE 1 TABLET BY MOUTH TWICE DAILY WITH MEALS  . Omega-3 Fatty Acids (FISH OIL  BURP-LESS) 1000 MG CAPS 2 caps by mouth twice daily.   Allergies  Allergen Reactions  .  Meloxicam  Shortness Of Breath    Not clear if true allergy--started out as shortness of breath only when bending over   . Naproxen  Shortness Of Breath     Review of Systems  Eyes:  Negative for visual disturbance (Has not called to get his yearly eye check with Dr. Octavia).  Respiratory:  Negative for shortness of breath.   Cardiovascular:  Negative for chest pain, palpitations and leg swelling.      Objective:   BP 116/76 (BP Location: Left Arm, Patient Position: Sitting, Cuff Size: Normal)   Pulse 75   Resp 17   Ht 5' 2.5 (1.588 m)   Wt 157 lb (71.2 kg)   BMI 28.26 kg/m   Physical Exam Bunion on right foot and tender over plantar aspect of mid heel.   Assessment & Plan       Hand out for dentists that take Medicaid

## 2024-08-09 ENCOUNTER — Other Ambulatory Visit: Payer: Self-pay | Admitting: Internal Medicine

## 2024-08-11 ENCOUNTER — Other Ambulatory Visit: Payer: Self-pay

## 2024-08-11 MED ORDER — ATORVASTATIN CALCIUM 80 MG PO TABS: 80.0000 mg | ORAL_TABLET | Freq: Every day | ORAL | 9 refills | Status: AC

## 2024-08-27 ENCOUNTER — Other Ambulatory Visit: Payer: Self-pay | Admitting: Internal Medicine

## 2024-10-03 ENCOUNTER — Telehealth: Payer: Self-pay | Admitting: Internal Medicine

## 2024-10-03 NOTE — Telephone Encounter (Signed)
 Patient called today and states he has been having heel pain on both of his heels, Patient states pain started two weeks ago,  Patient states he has tried new shoes but does not help ,   Patient would like to know if he needs to be seen by pcp or if he can get referred to a specialist.

## 2024-10-04 NOTE — Telephone Encounter (Signed)
 Patient has been scheduled

## 2024-10-04 NOTE — Telephone Encounter (Signed)
 Per Dr. Adella patient needs to be seen.   10/04/24- called patient to offer appointment patient did not answer

## 2024-10-06 ENCOUNTER — Ambulatory Visit: Admitting: Internal Medicine

## 2024-10-06 VITALS — BP 130/80 | HR 68 | Ht 62.5 in | Wt 163.0 lb

## 2024-10-06 DIAGNOSIS — M722 Plantar fascial fibromatosis: Secondary | ICD-10-CM | POA: Diagnosis not present

## 2024-10-06 NOTE — Progress Notes (Signed)
 My Note Deleted11:12 AM  : explanation: this note was inadvertantly deleted midway, had to copy it and finish it.  Ignore the copied extra note that follows this.    Expand All Collapse All Patient ID: Parker Harvey, male   DOB: 01-Oct-1961, 63 y.o.   MRN: [id number]  I, MD in the training under Dr. Adella, did initial H/P then presented to Dr. Adella who also evaluated pt and did orders, pt counciling, pt education and discharge instructions ( written and verbal) with me in the room . This is a record of both evaluations.      CC- heel pain   HPI- 4 weeks of B heel pain. When he gets up in the morning, it is more sore but no swelling.  Sounds like soreness decreases after he walks a bit although he does not say this. It is there some  at rest but gets worse when he stands or walks on it.   Cannot rate its intensity but sounds like it is mild. It is there enough to get his attention.  Radiates to the achilles tendons B.  Soaked it in peroxide and alcohol but nothing working.  Pads did not work.   Tylenol   prn helps but does not make it go away. Told not to take ibuprofen by Dr. CHRISTELLA.  Due to CKD/ Has not worked for 20 years. Used to be a Arboriculturist. Walks 1/2 mile a day. No running.   Has a threadtmill but not used it for a year.   Floors are carpeted cement.  Wears slippers at home with little cushioning. Wears no flip flops. Wears rain boots sometimes but his legs do not slide .  Wears 2 socks with the boots.   No prior history of this per pt but EPIC reports h/o B plantar Fascitis.  No joint pain.  H/o low back pain s/p surgery 2010 that is stable.   PMH: Reviewed on EPIC.   Pertinent PMH -DM with Hgb A1C 6.5 in 6/25.  B plantar Fascitis B toe nail fungus S/p terbinafine  treatment 2017,2020 - it got better but then came back again.      Past Surgical History:  Procedure Laterality Date   BUNIONECTOMY Left 2005    [facility]   low back surgery   2012    Dr. Onetha:  L4-5 and  L5-S1 decompression and stabilization surgery          Physical Exam Constitutional:      General: He is not in acute distress.    Appearance: Normal appearance. He is not ill-appearing.     Slightly overweight  Cardiovascular:  DP intact B.   Cap refills <2 sec B Musculoskeletal:  Left foot S/p bunionectomy a while back: tender heel and plantar fascia and achilles tender that is mild without bony tenderness.    Right great toenail- onychomycosis. Right foot has a bunion. There is intact ROM at the 1st MTP joint w/o swelling Tender heel and plantar fascia and achilles tendon- mild. Normal gait. Can jump and walk on tip-toes. Difficult to walk on both heels.   Full ROM at ankles and foot and toe joints.  No swelling in the feet or hands.    Skin:    General: Skin is warm and dry in feet    Findings: No erythema or lesion or ulcers in feet Neurological:     General: No focal deficit present.     Mental Status: He is  alert.     Sensory: No sensory deficit (Fine touch sensation intact in the toes and feet area).     Motor: intact toes flexion/extension, ankle flexion and extension B    Gait: Gait normal.  Psychiatric:        Mood and Affect: Mood normal.        Behavior: Behavior normal.        Thought Content: Thought content normal.        Judgment: Judgment normal.    Pertinent records on EPIC reviewed.   A/P - Acute B Plantar Fascitis- - Get heel cups with arch support; pt shown  these things on the internet.  Obtain it from [facility] - Wear the heel pads all the time when out. -Counciled to not go bare foot and wear something like [employer]ogs to have support at home -Exercises:1- bottle of water rolling under foot, 2- make figure 4 and stretch the plantar fascia and achilles heel, as demonstrated by Dr. adella -Stay off the Ibuprofen due to CKD -If not better in 2 months, pt to call Podiatry or return to this Clinic.      Deleted by: Fleta Bourbon, MD at 10/06/2024 12:39 PM

## 2024-10-06 NOTE — Patient Instructions (Addendum)
 No walking in bare or stockinged feet Get some good thick bottomed slippers to wear inside at all times when on feet. Stretches twice daily Ice water bottle foot exercises twice daily. Move heel cups to whichever shoe you are wearing.

## 2024-10-06 NOTE — Progress Notes (Deleted)
 Patient ID: Parker Harvey, male   DOB: 1961-12-08, 63 y.o.   MRN: [id number]

## 2024-10-06 NOTE — Progress Notes (Deleted)
 Patient ID: Parker Harvey, male   DOB: April 04, 1961, 63 y.o.   MRN: [id number]  BP 130/80  left arm, sitting P 68 R20 wt 163 pounds 62 inches  CC- heel pain  HPI- 4 weeks of B heel pain. When he gets up in the morning, it is more sore but no swelling.  Sounds like soreness decreases after he walks a bit although he does not say this. It is there some  at rest but gets worse when he stands or walks on it.   Cannot rate its intensity but sounds like it is mild. It is there enough to get his attention.  Radiates to the achilles tendons B.  Soaked it in peroxide and alcohol but nothing working.  Pads did not work.   Tylenol   prn helps but does not make it go away. Told not to take ibuprofen by Dr. CHRISTELLA. Has not workd for 20 years. Used to be a Arboriculturist. Walks 1/2 mile a day. No running.   Has a threadtmill but not used it for a year.   Floors are carpeted cement.  Wears slippers at home with little cushioning. Wears no flip flops. Wears rain boots sometimes but his legs do not slide .  Wears 2 socks with the boots.   No prior history of this per pt but EPIC reports h/o B plantar Fascitits.  No joint pain.  H/o low back pain s/p surgery 2010 that is stable.  PMH: Reviewed on EPIC.   Pertinant PMH -DM with hgb A1C 6.5 in 6/25.  B plantar Fascitis B toe nail fungus s/p turbinafine treatment 2017,2020 - it got better but then came back again. Past Surgical History:  Procedure Laterality Date  . BUNIONECTOMY Left 2005   [facility]  . low back surgery  2012   Dr. Onetha:  L4-5 and L5-S1 decompression and stabilization surgery     Physical Exam Constitutional:      General: He is not in acute distress.    Appearance: Normal appearance. He is not ill-appearing.     Comments: Slightly overweight  Cardiovascular:     Comments: DP intact B.   Cap refills <2 sec B Musculoskeletal:     Comments: Left foot s/p bunionectomy a while back: tender heel and plantar fascia and achilles tender that is mild  without bony tenderness.  Right great toenail- onchomycosis. Right foot has a bunion. There is intact ROM at the 1st MTP joint w/o swelling Tender heel and plantar fascia- mild Normal gait. Can jump and walk on tip-toes. Difficult to walk on both heels.  B achilles heel tenderness/ Full ROM at ankles and foot and toe joints.  No swelliing anywhere    Skin:    General: Skin is warm and dry.     Findings: No erythema or lesion.     Comments: No ulcer  Neurological:     General: No focal deficit present.     Mental Status: He is alert.     Sensory: No sensory deficit (Fine touch sensation intact in the toes and feet area).     Motor: No weakness.     Gait: Gait normal.  Psychiatric:        Mood and Affect: Mood normal.        Behavior: Behavior normal.        Thought Content: Thought content normal.        Judgment: Judgment normal.     Right foot- NO toe nail fungus ,  hallus valgus Left foot 1st toe fungal infection s/p bunion removal. B heel tenderness and plantar fascia tenderness.  Tender achilles tender B.  Arches of feet look a  little fallen. No ulcers Can walk , jump, walk on tiptoes but cannot walk on heels DP intact B. Capr refill <2 seconds Neuro- sensation intact B feet.  Strength grossly intact.   A/P - Acute B Plantar Fascitis - Get heel cups with arch support; pt shown  these things on the internet.  Obtain it from [facility] - Wear the heel pads all the time when out. -Counciled to not go bare foot and wear something like [employer]ogs to have support at home -Exercises:1- bottle of water rolling under foot, 2- make figure 4 and stretch the plantar fascia and achilles heel, as demonstrated by Dr. adella -Stay off the Ibuprofen due to CKD -If not better in 2 months, call Podiatry or return to the Clinic.

## 2024-10-12 ENCOUNTER — Encounter: Payer: Self-pay | Admitting: Podiatry

## 2024-10-12 ENCOUNTER — Ambulatory Visit

## 2024-10-12 ENCOUNTER — Ambulatory Visit: Admitting: Podiatry

## 2024-10-12 VITALS — Ht 62.5 in | Wt 163.0 lb

## 2024-10-12 DIAGNOSIS — M722 Plantar fascial fibromatosis: Secondary | ICD-10-CM | POA: Diagnosis not present

## 2024-10-12 DIAGNOSIS — M216X1 Other acquired deformities of right foot: Secondary | ICD-10-CM | POA: Diagnosis not present

## 2024-10-12 DIAGNOSIS — M216X2 Other acquired deformities of left foot: Secondary | ICD-10-CM | POA: Diagnosis not present

## 2024-10-12 MED ORDER — BETAMETHASONE SOD PHOS & ACET 6 (3-3) MG/ML IJ SUSP: 3.0000 mg | Freq: Once | INTRAMUSCULAR | Status: DC

## 2024-10-12 MED ORDER — METHYLPREDNISOLONE 4 MG PO TBPK: ORAL_TABLET | ORAL | 0 refills | Status: DC

## 2024-10-12 NOTE — Progress Notes (Signed)
   Chief Complaint  Patient presents with   Foot Pain    Pt is here due to bilateral heel pain that has been going on for a month, he states he has been stretching the foot, walking with shoes always and takes tylenol .    HPI: 63 y.o. male presenting today for evaluation of pain and tenderness to the bilateral heels.  Ongoing for several weeks.  Idiopathic onset.  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  Previously followed by Dr. Letha for chronic pain.  Epidural injections help, but pt. concernd with having too many and they hurt.   Dental decay    Diabetes type 2, controlled (HCC)    GERD (gastroesophageal reflux disease)    Hearing loss    Hyperlipidemia 2017   Presbyopia     Past Surgical History:  Procedure Laterality Date   BUNIONECTOMY Left 2005   [facility]   low back surgery  2012   Dr. Onetha:  L4-5 and L5-S1 decompression and stabilization surgery    Allergies  Allergen Reactions   Meloxicam  Shortness Of Breath    Not clear if true allergy--started out as shortness of breath only when bending over    Naproxen  Shortness Of Breath     Physical Exam: General: The patient is alert and oriented x3 in no acute distress.  Dermatology: Skin is warm, dry and supple bilateral lower extremities.   Vascular: Palpable pedal pulses bilaterally. Capillary refill within normal limits.  No appreciable edema.  No erythema.  Neurological: Grossly intact via light touch  Musculoskeletal Exam: Pain on palpation to the plantar fascia bilateral heels.  Hallux valgus deformity also noted right foot.  Radiographic Exam B/L feet 10/12/2024:  Plantar heel spur noted on lateral view.  Hallux valgus deformity noted right foot.  History of bunionectomy surgery  to the first metatarsal left foot which appears stable  1.  Hallux valgus/bunion right 2.  History of bunionectomy LT over 20  years ago 3.  Plantar fasciitis bilateral  -Patient evaluated.  X-rays reviewed -Injection of 0.5 cc Celestone Soluspan injected in the plantar fascia bilateral -Prescription for Medrol  Dosepak -Refrain from going barefoot.  I do believe that custom molded orthotics to support the medial longitudinal arch of the foot should help significantly.  Today the patient was scanned and molded for custom molded insoles -In the meantime OTC power step insoles were dispensed.  Wear daily until we can get him his custom  orthotics -Return to clinic 4 weeks      Thresa EMERSON Sar, DPM [facility]  Dr. Thresa EMERSON Sar, DPM    [address] [place], [place] [address]                Office [phone]  Fax [phone]

## 2024-11-09 ENCOUNTER — Ambulatory Visit: Admitting: Podiatry

## 2024-11-09 VITALS — Ht 62.5 in | Wt 163.0 lb

## 2024-11-09 DIAGNOSIS — M722 Plantar fascial fibromatosis: Secondary | ICD-10-CM

## 2024-11-09 NOTE — Progress Notes (Signed)
   Chief Complaint  Patient presents with   Plantar Fasciitis    Pt is here to f/u on bilateral foot pain due to plantar fasciitis, he states that the right foot is fine, but is still having pain in the left foot.    HPI: 63 y.o. 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  Previously followed by Dr. Letha for chronic pain.  Epidural injections help, but pt. concernd with having too many and they hurt.   Dental decay    Diabetes type 2, controlled (HCC)    GERD (gastroesophageal reflux disease)    Hearing loss    Hyperlipidemia 2017   Presbyopia     Past Surgical History:  Procedure Laterality Date   BUNIONECTOMY Left 2005   [facility]   low back surgery  2012   Dr. Onetha:  L4-5 and L5-S1 decompression and stabilization surgery    Allergies  Allergen Reactions   Meloxicam  Shortness Of Breath    Not clear if true allergy--started out as shortness of breath only when bending over    Naproxen  Shortness Of Breath     Physical Exam: General: The patient is alert and oriented x3 in no acute distress.  Dermatology: Skin is warm, dry and supple bilateral lower extremities.   Vascular: Palpable pedal pulses bilaterally. Capillary refill within normal limits.  No appreciable edema.  No erythema.  Neurological: Grossly intact via light touch  Musculoskeletal Exam: There continues to be pain on palpation to the plantar fascia bilateral heels RT > LT.  Hallux valgus deformity also noted right foot.  Radiographic Exam B/L feet 10/12/2024:  Plantar heel spur noted on lateral view.  Hallux valgus deformity noted right foot.  History of bunionectomy surgery  to the first metatarsal left foot which appears stable  1.  Hallux valgus/bunion right 2.  History of bunionectomy LT over 20 years ago 3.  Plantar  fasciitis bilateral  -Patient evaluated.   -Injection of 0.5 cc Celestone  Soluspan injected in the plantar fascia bilateral -Prescription for Medrol  Dosepak -Custom orthotics pending dispensable -Return to clinic for orthotics dispensable       Thresa EMERSON Sar, DPM [facility]  Dr. Thresa EMERSON Sar, DPM    [address] [place], [place] [address]                Office [phone]  Fax [phone]

## 2024-11-14 ENCOUNTER — Telehealth: Payer: Self-pay | Admitting: Podiatry

## 2024-11-14 MED ORDER — METHYLPREDNISOLONE 4 MG PO TBPK: ORAL_TABLET | ORAL | 0 refills | Status: AC

## 2024-11-14 NOTE — Telephone Encounter (Signed)
 Was seen 11/09/24 and was supposed to have: Prescription for Medrol  Dosepak - sent to pharmacy on file. Patient states pharmacy did not received. Please re-send. TY

## 2024-11-20 DIAGNOSIS — M722 Plantar fascial fibromatosis: Secondary | ICD-10-CM | POA: Diagnosis not present

## 2024-11-20 MED ORDER — BETAMETHASONE SOD PHOS & ACET 6 (3-3) MG/ML IJ SUSP
3.0000 mg | Freq: Once | INTRAMUSCULAR | Status: AC
  Administered 2024-11-20: 3 mg via INTRA_ARTICULAR

## 2024-11-23 ENCOUNTER — Encounter: Payer: Self-pay | Admitting: Podiatry

## 2024-11-23 ENCOUNTER — Ambulatory Visit (INDEPENDENT_AMBULATORY_CARE_PROVIDER_SITE_OTHER): Admitting: Podiatry

## 2024-11-23 VITALS — Ht 62.5 in | Wt 163.0 lb

## 2024-11-23 DIAGNOSIS — M722 Plantar fascial fibromatosis: Secondary | ICD-10-CM

## 2024-11-23 NOTE — Progress Notes (Signed)
° °  Chief Complaint  Patient presents with   Plantar Fasciitis    Pt is here to f/u on bilateral feet due to plantar fasciitis, he states the pain in both feet is a lot better and has no other concerns.    HPI: 63 y.o. male presenting today for follow-up evaluation of plantar fasciitis bilateral.  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  Previously followed by Dr. Letha for chronic pain.  Epidural injections help, but pt. concernd with having too many and they hurt.   Dental decay    Diabetes type 2, controlled (HCC)    GERD (gastroesophageal reflux disease)    Hearing loss    Hyperlipidemia 2017   Presbyopia     Past Surgical History:  Procedure Laterality Date   BUNIONECTOMY Left 2005   [facility]   low back surgery  2012   Dr. Onetha:  L4-5 and L5-S1 decompression and stabilization surgery    Allergies  Allergen Reactions   Meloxicam  Shortness Of Breath    Not clear if true allergy--started out as shortness of breath only when bending over    Naproxen  Shortness Of Breath     Physical Exam: General: The patient is alert and oriented x3 in no acute distress.  Dermatology: Skin is warm, dry and supple bilateral lower extremities.   Vascular: Palpable pedal pulses bilaterally. Capillary refill within normal limits.  No appreciable edema.  No erythema.  Neurological: Grossly intact via light touch  Musculoskeletal Exam: Negative for any significant pain on palpation to the plantar fascia bilateral heels RT > LT.  Hallux valgus deformity also noted right foot.  Radiographic Exam B/L feet 10/12/2024:  Plantar heel spur noted on lateral view.  Hallux valgus deformity noted right foot.  History of bunionectomy surgery  to the first metatarsal left foot which appears stable  1.  Hallux valgus/bunion  right 2.  History of bunionectomy LT over 20 years ago 3.  Plantar fasciitis bilateral  -Patient evaluated.   - Continue OTC Tylenol  as needed -Continue good supportive tennis shoes and sneakers -Custom orthotics pending dispensable -Return to clinic orthotics pickup       Thresa EMERSON Sar, DPM [facility]  Dr. Thresa EMERSON Sar, DPM    [address] [place], [place] [address]                Office [phone]  Fax [phone]

## 2025-01-02 ENCOUNTER — Encounter: Payer: Self-pay | Admitting: Internal Medicine

## 2025-01-02 ENCOUNTER — Other Ambulatory Visit: Payer: Self-pay | Admitting: Internal Medicine

## 2025-01-02 NOTE — Progress Notes (Signed)
 See note from August 2025 with similar complaints and Dx of bilateral plantar fasciitis. Agree with evaluation and recommendations by Dr. Fleta

## 2025-02-02 ENCOUNTER — Other Ambulatory Visit: Payer: Medicaid Other

## 2025-02-06 ENCOUNTER — Encounter: Payer: Medicaid Other | Admitting: Internal Medicine
